# Patient Record
Sex: Female | Born: 1965 | Race: Black or African American | Hispanic: No | Marital: Single | Smoking: Never smoker
Health system: Southern US, Community
[De-identification: ages and names within clinical notes are randomized; demographics above are authoritative.]

## PROBLEM LIST (undated history)

## (undated) DIAGNOSIS — D649 Anemia, unspecified: Secondary | ICD-10-CM

## (undated) DIAGNOSIS — D219 Benign neoplasm of connective and other soft tissue, unspecified: Secondary | ICD-10-CM

## (undated) DIAGNOSIS — B009 Herpesviral infection, unspecified: Secondary | ICD-10-CM

## (undated) HISTORY — DX: Benign neoplasm of connective and other soft tissue, unspecified: D21.9

## (undated) HISTORY — PX: WISDOM TOOTH EXTRACTION: SHX21

## (undated) HISTORY — DX: Herpesviral infection, unspecified: B00.9

## (undated) HISTORY — PX: OTHER SURGICAL HISTORY: SHX169

---

## 2005-02-19 DIAGNOSIS — L309 Dermatitis, unspecified: Secondary | ICD-10-CM

## 2005-02-19 HISTORY — DX: Dermatitis, unspecified: L30.9

## 2005-12-05 DIAGNOSIS — Z91040 Latex allergy status: Secondary | ICD-10-CM | POA: Insufficient documentation

## 2005-12-05 DIAGNOSIS — Z91018 Allergy to other foods: Secondary | ICD-10-CM | POA: Insufficient documentation

## 2006-12-11 ENCOUNTER — Ambulatory Visit: Payer: Self-pay | Admitting: Obstetrics & Gynecology

## 2006-12-11 ENCOUNTER — Encounter: Payer: Self-pay | Admitting: Obstetrics & Gynecology

## 2006-12-18 ENCOUNTER — Ambulatory Visit: Payer: Self-pay | Admitting: Nurse Practitioner

## 2008-01-26 ENCOUNTER — Encounter: Payer: Self-pay | Admitting: Family Medicine

## 2008-01-26 ENCOUNTER — Ambulatory Visit: Payer: Self-pay | Admitting: Obstetrics & Gynecology

## 2008-01-27 ENCOUNTER — Encounter: Payer: Self-pay | Admitting: Obstetrics & Gynecology

## 2008-01-27 LAB — CONVERTED CEMR LAB: Trich, Wet Prep: NONE SEEN

## 2009-03-08 ENCOUNTER — Ambulatory Visit: Payer: Self-pay | Admitting: Obstetrics & Gynecology

## 2009-03-08 ENCOUNTER — Other Ambulatory Visit: Admission: RE | Admit: 2009-03-08 | Discharge: 2009-03-08 | Payer: Self-pay | Admitting: Obstetrics & Gynecology

## 2009-03-08 LAB — CONVERTED CEMR LAB
Glucose, Bld: 87 mg/dL (ref 70–99)
LDL Cholesterol: 88 mg/dL (ref 0–99)
VLDL: 12 mg/dL (ref 0–40)

## 2009-07-12 ENCOUNTER — Ambulatory Visit: Payer: Self-pay | Admitting: Obstetrics & Gynecology

## 2009-07-13 ENCOUNTER — Encounter: Payer: Self-pay | Admitting: Obstetrics & Gynecology

## 2009-07-13 LAB — CONVERTED CEMR LAB: Trich, Wet Prep: NONE SEEN

## 2010-03-13 ENCOUNTER — Encounter: Payer: Self-pay | Admitting: Obstetrics & Gynecology

## 2010-03-13 ENCOUNTER — Other Ambulatory Visit: Admission: RE | Admit: 2010-03-13 | Discharge: 2010-03-13 | Payer: Self-pay | Admitting: Obstetrics & Gynecology

## 2010-03-13 ENCOUNTER — Ambulatory Visit: Payer: Self-pay | Admitting: Family Medicine

## 2010-03-13 LAB — CONVERTED CEMR LAB
HCV Ab: NEGATIVE
Hepatitis B Surface Ag: NEGATIVE

## 2010-03-23 ENCOUNTER — Ambulatory Visit: Payer: Self-pay | Admitting: Obstetrics & Gynecology

## 2010-03-23 ENCOUNTER — Encounter: Payer: Self-pay | Admitting: Obstetrics & Gynecology

## 2010-03-23 LAB — CONVERTED CEMR LAB
ALT: 9 units/L (ref 0–35)
Albumin: 4.1 g/dL (ref 3.5–5.2)
CO2: 25 meq/L (ref 19–32)
Calcium: 9.1 mg/dL (ref 8.4–10.5)
Chloride: 106 meq/L (ref 96–112)
Cholesterol: 131 mg/dL (ref 0–200)
Glucose, Bld: 84 mg/dL (ref 70–99)
HCT: 26.2 % — ABNORMAL LOW (ref 36.0–46.0)
MCV: 70.8 fL — ABNORMAL LOW (ref 78.0–100.0)
Platelets: 332 10*3/uL (ref 150–400)
RBC: 3.7 M/uL — ABNORMAL LOW (ref 3.87–5.11)
Sodium: 139 meq/L (ref 135–145)
Total Bilirubin: 0.3 mg/dL (ref 0.3–1.2)
Total Protein: 7.3 g/dL (ref 6.0–8.3)
Triglycerides: 37 mg/dL (ref <150)
WBC: 3.7 10*3/uL — ABNORMAL LOW (ref 4.0–10.5)

## 2010-04-24 ENCOUNTER — Ambulatory Visit (HOSPITAL_COMMUNITY)
Admission: RE | Admit: 2010-04-24 | Discharge: 2010-04-24 | Payer: Self-pay | Source: Home / Self Care | Attending: Family Medicine | Admitting: Family Medicine

## 2010-08-22 ENCOUNTER — Emergency Department: Payer: Self-pay | Admitting: Emergency Medicine

## 2010-08-28 NOTE — Assessment & Plan Note (Signed)
NAMENAMIRA, [name] NO.:  [id number]   MEDICAL RECORD NO.:  [id number]          PATIENT TYPE:  POB   LOCATION:  [facility] at [facility]         FACILITY:  [facility]   PHYSICIAN:  Elsie Lincoln, MD      DATE OF BIRTH:  Aug 19, 1965   DATE OF SERVICE:  12/18/2006                                  CLINIC NOTE   The patient presents for the results of her Pap smear as well as her STD  screen and cholesterol screenings. We did review her Pap smear as being  normal.  We have not gotten the results of her gonorrhea and chlamydia  as yet.  Hopefully, that will be back in the next few weeks.  We did  review her basic metabolic panel. She was concerned about diabetes and  her glucose, in fact, was at 58.  Her cholesterol was within normal  limits as well as her triglycerides, LDL, and HDL.  She did have a  negative RPR. She did have a positive HSV.  She had not disclosed this  at her last office visit but she has known genital herpes.  She has  taken Valtrex in the past and she will be given a prescription for  Valtrex to take 500 mg 1 p.o. q. day for suppression.  She was also  requesting an HIV test today.  She will be called with the results of  the gonorrhea, chlamydia, as well as HIV.      Remonia Richter, NP    ______________________________  Elsie Lincoln, MD    LR/MEDQ  D:  12/18/2006  T:  12/18/2006  Job:  [id number]

## 2010-08-28 NOTE — Assessment & Plan Note (Signed)
NAMEKAROL, SKARZYNSKI NO.:  [id number]   MEDICAL RECORD NO.:  [id number]          PATIENT TYPE:  POB   LOCATION:  [facility] at [facility]         FACILITY:  [facility]   PHYSICIAN:  Jaynie Collins, MD     DATE OF BIRTH:  [birth year]   DATE OF SERVICE:  03/13/2010                                  CLINIC NOTE   REASON FOR VISIT:  Annual examination.   Ms. Chiles is a 45 year old, gravida 1, para 1, with a last menstrual  period of February 22, 2010, who is here for annual exam, but she has no  complaints.   PAST OBSTETRIC AND GYNECOLOGIC HISTORY:  The patient has had one vaginal  deliveries.  She has regular menstrual periods.  No history of cervical  dysplasia.  The patient does have a history of genital HSV with rare  outbreaks.  No other gynecologic conditions.   PAST MEDICAL HISTORY:  Herpes simplex virus.   PAST SURGICAL HISTORY:  None.   MEDICATIONS:  Valtrex 500 mg as needed.   ALLERGIES:  The patient is allergic to LATEX which causes hives.  No  known drug allergies.   SOCIAL HISTORY:  The patient is in a monogamous relationship over the  past 16 years.  Her partner has had a vasectomy.  She lives with her son  and her mother.  She does not smoke, use alcohol or use any illicit drug  use.  She denies any past or current history of a sexual or physical  abuse.   FAMILY HISTORY:  Remarkable for breast cancer in a paternal grandmother  and also a history of diabetes and thyroid dysfunction.  She denies any  other gynecologic or other cancers.   REVIEW OF SYSTEMS:  Negative.  The patient did have her last mammogram  in 2009, which was negative and will be scheduled for mammogram at the  end of this encounter.   PHYSICAL EXAMINATION:  VITAL SIGNS:  Blood pressure is 106/69, pulse  108, weight 182 pounds, height 5 feet 7 inches.  GENERAL:  No apparent distress.  HEENT:  Normocephalic, atraumatic.  NECK:  Supple.  Normal thyroid.  No masses.  HEART:  Regular  rate and rhythm.  LUNGS:  Clear to auscultation bilaterally.  BREASTS:  Normal bilaterally.  No abnormal skin changes, masses, nipple  drainage or lymphadenopathy.  ABDOMEN:  Soft, nontender, nondistended.  No organomegaly.  EXTREMITIES:  No cyanosis, clubbing or edema.  PELVIC:  Normal external female genitalia.  Pink and well rugated  vagina.  Multiparous cervical os.  Pap smear was obtained.  Small mobile  uterus, nontender.  Adnexa is nontender and no abnormal masses palpated.   ASSESSMENT AND PLAN:  The patient is a 45 year old, gravida 1, para 1,  here for annual examination.  A breast exam was done today and the  patient will be scheduled for a mammogram.  The patient also had a Pap  smear done today.  We will follow up on the result.  She did report  having some malodorous discharge and so ancillary testing was done for  the Pap smear sample for Trichomonas, yeast and for bacterial vaginosis  and would also do  gonorrhea and Chlamydia tested from this sample as per  the patient's request.  The patient would also have serum testing for  hepatitis B, C, HIV and RPR, and we will follow up on these results.  The patient was told to call or come back in if she has any further  gynecologic concerns.           ______________________________  Jaynie Collins, MD     UA/MEDQ  D:  03/13/2010  T:  03/14/2010  Job:  [id number]

## 2010-08-28 NOTE — Assessment & Plan Note (Signed)
NAMEUDELL, BLASINGAME NO.:  [id number]   MEDICAL RECORD NO.:  [id number]          PATIENT TYPE:  POB   LOCATION:  [facility] at [facility]         FACILITY:  [facility]   PHYSICIAN:  Allie Bossier, MD        DATE OF BIRTH:  1965/09/19   DATE OF SERVICE:  03/08/2009                                  CLINIC NOTE   Ms. Tesoriero is a 46 year old female black gravida 1, para 1.  She has an  49 year old son.  She comes in here for annual exam.  She has no  complaints.   PAST MEDICAL HISTORY:  Rare outbreaks of HSV.   REVIEW OF SYSTEMS:  Monogamous for the last 15 years.  Her partner has  had a vasectomy.  She has not yet had a mammogram.   ALLERGIES:  No known drug allergies.  She is LATEX allergic.   SOCIAL HISTORY:  Negative for tobacco, alcohol or drug use.   FAMILY HISTORY:  Positive for breast cancer in a paternal grandmother.  She denies a family history of GYN or colon cancers.  Diabetes is in her  mother and a maternal and thyroid disease is in a paternal aunt.   MEDICATIONS:  She takes Valtrex 500 mg on a p.r.n. basis.   PHYSICAL EXAMINATION:  VITAL SIGNS:  Weight 182, height 5 feet 7 inches,  blood pressure 119/72, pulse 79.  HEENT:  Normal.  HEART:  Regular rate and rhythm.  LUNGS:  Clear to auscultation bilaterally.  BREASTS:  Normal bilaterally.  ABDOMEN:  No palpable hepatosplenomegaly.  PELVIC:  External genitalia normal.  Cervix is parous.  She just started  her period today.  Bimanual exam reveals a 6-week size uterus that is  minimally mobile, nontender.  Her adnexa are nontender.  No masses.   ASSESSMENT AND PLAN:  Annual exam.  I have checked Pap smear and we will  schedule her mammogram.  With regard to her general health maintenance  and family history, I am checking a fasting glucose, TSH and fasting  lipids today.  I have recommended that she continue her weight loss and  start an exercise plan.      Allie Bossier, MD    MCD/MEDQ  D:   03/08/2009  T:  03/09/2009  Job:  [id number]

## 2010-08-28 NOTE — Assessment & Plan Note (Signed)
NAME:  [name], [name] NO.:  [id number]   MEDICAL RECORD NO.:  [id number]          PATIENT TYPE:  POB   LOCATION:  [facility] at [facility]         FACILITY:  [facility]   PHYSICIAN:  Allie Bossier, MD        DATE OF BIRTH:  02-09-1966   DATE OF SERVICE:  01/26/2008                                  CLINIC NOTE   [name] is a 45 year old single black gravida 1, para 1 woman with a  68 year old son.  She comes here for her annual exam.  She complains of  a foul-smelling grayish colored vaginal discharge recently.  She denies  dyspareunia.   PAST MEDICAL HISTORY:  HSV, for which she takes Valtrex 500 mg on a  p.r.n. basis.   REVIEW OF SYSTEMS:  She is monogamous for the last 14 years.  They do  not live together.  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  Her Pap smear in 2008 was normal.   ALLERGIES:  No known drug allergies.  She is LATEX allergic.   SOCIAL HISTORY:  Negative for tobacco, alcohol, or drug use.   FAMILY HISTORY:  Positive for breast cancer in a paternal grandmother.  She denies a family history of GYN or colon cancers.   PHYSICAL EXAMINATION:  VITAL SIGNS:  Weight 184 pounds, height 5 feet 7,  blood pressure 95/67, pulse 81.  HEENT:  Normal.  HEART:  Regular rate and rhythm.  LUNGS:  Clear to auscultation bilaterally.  BREASTS:  Normal.  No masses.  No skin changes.  No nipple discharge.  ABDOMEN:  Benign.  No palpable hepatosplenomegaly.  EXTERNAL GENITALIA:  Unshaved, no lesions.  Cervix parous.  Her  discharge is unremarkable.  A wet prep was obtained.  Pap smears were  done.   ASSESSMENT AND PLAN:  Annual exam.  I have checked a Pap smear.  Recommend self-breast and self-vulvar exams monthly.  With regard to her  vaginal smelly discharge, I have checked a wet prep and will treat  accordingly based on the results when they are available.  A mammogram  will be scheduled.      Allie Bossier, MD     MCD/MEDQ  D:   01/26/2008  T:  01/27/2008  Job:  [id number]

## 2010-08-28 NOTE — Assessment & Plan Note (Signed)
[name], [name] NO.:  [id number]   MEDICAL RECORD NO.:  [id number]          PATIENT TYPE:  POB   LOCATION:  [facility] at [facility]         FACILITY:  [facility]   PHYSICIAN:  Elsie Lincoln, MD      DATE OF BIRTH:  11/25/1965   DATE OF SERVICE:                                  CLINIC NOTE   Patient comes to the office today for her yearly PAP smear.  She has  been having some ongoing vaginal itching for the past 2 weeks.  She also  complains of some odor as well as some brownish color discharge.  She  does admit later that she is still having her menstrual cycle.  She has  a few sexual partners.  One partner, she has been in a relationship for  approximately 13 years and does not use any type of protection.  The  second partner, she has been in a relationship with for 2 years and has  been using condoms with this partner.  She does also admit to having  some vaginal dryness.  She is concerned about her blood sugar.  She did  have a physical in her office and was told that her blood sugar was  elevated.  She feels that this may also have something to do with her  vaginal itching.   PHYSICAL EXAMINATION:  VITAL SIGNS:  Blood pressure 113/73, pulse 72,  weight 176.  HEENT:  Head is normocephalic and atraumatic.  Eyes are PERRLA.  NECK:  Thyroid without masses or enlargement.  CARDIAC:  Regular rate and rhythm.  LUNGS:  Clear bilaterally.  BREASTS:  Symmetrical without masses.  No lymphadenopathy.  ABDOMEN:  Soft, nontender, positive bowel sounds.  GENITALIA:  Externally there are no lesions or discharges.  Internally,  she is having her menstrual cycle and there is bright red blood in the  vaginal vault.  Her cervix is without lesions.  Bimanual exam, no  cervical motion tenderness.  No adnexal mass.  EXTREMITIES:  Warm without edema.  NEUROLOGIC:  Alert and oriented x3.   ASSESSMENT/PLAN:  1. Yearly PAP and pelvic.  2. Vaginal discharge.  She will be given  Terazol 7 to use nightly for      7 nights.  We will also check a chem 20, lipid panel, HSC, PC,      chlamydia and RPR.  We will follow up in 1 week to review the      results of these findings.      Remonia Richter, NP    ______________________________  Elsie Lincoln, MD    LR/MEDQ  D:  12/11/2006  T:  12/12/2006  Job:  [id number]

## 2011-03-21 ENCOUNTER — Ambulatory Visit: Payer: Self-pay | Admitting: Obstetrics & Gynecology

## 2011-03-21 ENCOUNTER — Other Ambulatory Visit: Payer: Self-pay | Admitting: Family Medicine

## 2011-03-21 DIAGNOSIS — Z1231 Encounter for screening mammogram for malignant neoplasm of breast: Secondary | ICD-10-CM

## 2011-05-02 ENCOUNTER — Ambulatory Visit (HOSPITAL_COMMUNITY)
Admission: RE | Admit: 2011-05-02 | Discharge: 2011-05-02 | Disposition: A | Discharge status: Home / Self Care | Payer: Medicaid Other | Source: Ambulatory Visit | Attending: Family Medicine | Admitting: Family Medicine

## 2011-05-02 DIAGNOSIS — Z1231 Encounter for screening mammogram for malignant neoplasm of breast: Secondary | ICD-10-CM

## 2011-05-14 ENCOUNTER — Encounter: Payer: Self-pay | Admitting: Nurse Practitioner

## 2011-05-14 ENCOUNTER — Other Ambulatory Visit: Payer: Self-pay | Admitting: Obstetrics & Gynecology

## 2011-05-14 ENCOUNTER — Other Ambulatory Visit (HOSPITAL_COMMUNITY)
Admission: RE | Admit: 2011-05-14 | Discharge: 2011-05-14 | Disposition: A | Discharge status: Home / Self Care | Payer: Medicaid Other | Source: Ambulatory Visit | Attending: Obstetrics & Gynecology | Admitting: Obstetrics & Gynecology

## 2011-05-14 ENCOUNTER — Ambulatory Visit (INDEPENDENT_AMBULATORY_CARE_PROVIDER_SITE_OTHER): Payer: Medicaid Other | Admitting: Nurse Practitioner

## 2011-05-14 DIAGNOSIS — Z0000 Encounter for general adult medical examination without abnormal findings: Secondary | ICD-10-CM

## 2011-05-14 DIAGNOSIS — A499 Bacterial infection, unspecified: Secondary | ICD-10-CM

## 2011-05-14 DIAGNOSIS — B9689 Other specified bacterial agents as the cause of diseases classified elsewhere: Secondary | ICD-10-CM

## 2011-05-14 DIAGNOSIS — N760 Acute vaginitis: Secondary | ICD-10-CM | POA: Insufficient documentation

## 2011-05-14 DIAGNOSIS — B009 Herpesviral infection, unspecified: Secondary | ICD-10-CM

## 2011-05-14 DIAGNOSIS — Z01419 Encounter for gynecological examination (general) (routine) without abnormal findings: Secondary | ICD-10-CM | POA: Insufficient documentation

## 2011-05-14 DIAGNOSIS — D649 Anemia, unspecified: Secondary | ICD-10-CM | POA: Insufficient documentation

## 2011-05-14 HISTORY — DX: Herpesviral infection, unspecified: B00.9

## 2011-05-14 MED ORDER — FERROUS SULFATE 325 (65 FE) MG PO TABS: 325.0000 mg | ORAL_TABLET | Freq: Every day | ORAL | Status: DC

## 2011-05-14 MED ORDER — VALACYCLOVIR HCL 500 MG PO TABS: 500.0000 mg | ORAL_TABLET | Freq: Every day | ORAL | Status: DC

## 2011-05-14 MED ORDER — METRONIDAZOLE 500 MG PO TABS: 500.0000 mg | ORAL_TABLET | Freq: Two times a day (BID) | ORAL | Status: DC

## 2011-05-14 NOTE — Patient Instructions (Signed)
Bacterial Vaginosis Bacterial vaginosis (BV) is a vaginal infection where the normal balance of bacteria in the vagina is disrupted. The normal balance is then replaced by an overgrowth of certain bacteria. There are several different kinds of bacteria that can cause BV. BV is the most common vaginal infection in women of childbearing age. CAUSES   The cause of BV is not fully understood. BV develops when there is an increase or imbalance of harmful bacteria.   Some activities or behaviors can upset the normal balance of bacteria in the vagina and put women at increased risk including:   Having a new sex partner or multiple sex partners.   Douching.   Using an intrauterine device (IUD) for contraception.   It is not clear what role sexual activity plays in the development of BV. However, women that have never had sexual intercourse are rarely infected with BV.  Women do not get BV from toilet seats, bedding, swimming pools or from touching objects around them.  SYMPTOMS   Grey vaginal discharge.   A fish-like odor with discharge, especially after sexual intercourse.   Itching or burning of the vagina and vulva.   Burning or pain with urination.   Some women have no signs or symptoms at all.  DIAGNOSIS  Your caregiver must examine the vagina for signs of BV. Your caregiver will perform lab tests and look at the sample of vaginal fluid through a microscope. They will look for bacteria and abnormal cells (clue cells), a pH test higher than 4.5, and a positive amine test all associated with BV.  RISKS AND COMPLICATIONS   Pelvic inflammatory disease (PID).   Infections following gynecology surgery.   Developing HIV.   Developing herpes virus.  TREATMENT  Sometimes BV will clear up without treatment. However, all women with symptoms of BV should be treated to avoid complications, especially if gynecology surgery is planned. Female partners generally do not need to be treated. However,  BV may spread between female sex partners so treatment is helpful in preventing a recurrence of BV.   BV may be treated with antibiotics. The antibiotics come in either pill or vaginal cream forms. Either can be used with nonpregnant or pregnant women, but the recommended dosages differ. These antibiotics are not harmful to the baby.   BV can recur after treatment. If this happens, a second round of antibiotics will often be prescribed.   Treatment is important for pregnant women. If not treated, BV can cause a premature delivery, especially for a pregnant woman who had a premature birth in the past. All pregnant women who have symptoms of BV should be checked and treated.   For chronic reoccurrence of BV, treatment with a type of prescribed gel vaginally twice a week is helpful.  HOME CARE INSTRUCTIONS   Finish all medication as directed by your caregiver.   Do not have sex until treatment is completed.   Tell your sexual partner that you have a vaginal infection. They should see their caregiver and be treated if they have problems, such as a mild rash or itching.   Practice safe sex. Use condoms. Only have 1 sex partner.  PREVENTION  Basic prevention steps can help reduce the risk of upsetting the natural balance of bacteria in the vagina and developing BV:  Do not have sexual intercourse (be abstinent).   Do not douche.   Use all of the medicine prescribed for treatment of BV, even if the signs and symptoms go away.     Tell your sex partner if you have BV. That way, they can be treated, if needed, to prevent reoccurrence.  SEEK MEDICAL CARE IF:   Your symptoms are not improving after 3 days of treatment.   You have increased discharge, pain, or fever.  MAKE SURE YOU:   Understand these instructions.   Will watch your condition.   Will get help right away if you are not doing well or get worse.  FOR MORE INFORMATION  Division of STD Prevention (DSTDP), Centers for Disease  Control and Prevention: SolutionApps.co.za American Social Health Association (ASHA): www.ashastd.org  Document Released: 04/01/2005 Document Revised: 12/12/2010 Document Reviewed: 09/22/2008 [facility] Patient Information 2012 [place], [place].Iron Deficiency Anemia There are many types of anemia. Iron deficiency anemia is the most common. Iron deficiency anemia is a decrease in the number of red blood cells caused by too little iron. Without enough iron, your body does not produce enough hemoglobin. Hemoglobin is a substance in red blood cells that carries oxygen to the body's tissues. Iron deficiency anemia may leave you tired and short of breath. CAUSES   Lack of iron in the diet.   This may be seen in infants and children, because there is little iron in milk.   This may be seen in adults who do not eat enough iron-rich foods.   This may be seen in pregnant or breastfeeding women who do not take iron supplements. There is a much higher need for iron intake at these times.   Poor absorption of iron, as seen with intestinal disorders.   Intestinal bleeding.   Heavy periods.  SYMPTOMS  Mild anemia may not be noticeable. Symptoms may include:  Fatigue.   Headache.   Pale skin.   Weakness.   Shortness of breath.   Dizziness.   Cold hands and feet.   Fast or irregular heartbeat.  DIAGNOSIS  Diagnosis requires a thorough evaluation and physical exam by your caregiver.  Blood tests are generally used to confirm iron deficiency anemia.   Additional tests may be done to find the underlying cause of your anemia. These may include:   Testing for blood in the stool (fecal occult blood test).   A procedure to see inside the colon and rectum (colonoscopy).   A procedure to see inside the esophagus and stomach (endoscopy).  TREATMENT   Correcting the cause of the iron deficiency is the first step.   Medicines, such as oral contraceptives, can make heavy menstrual flows lighter.     Antibiotics and other medicines can be used to treat peptic ulcers.   Surgery may be needed to remove a bleeding polyp, tumor, or fibroid.   Often, iron supplements (ferrous sulfate) are taken.   For the best iron absorption, take these supplements with an empty stomach.   You may need to take the supplements with food if you cannot tolerate them on an empty stomach. Vitamin C improves the absorption of iron. Your caregiver may recommend taking your iron tablets with a glass of orange juice or vitamin C supplement.   Milk and antacids should not be taken at the same time as iron supplements. They may interfere with the absorption of iron.   Iron supplements can cause constipation. A stool softener is often recommended.   Pregnant and breastfeeding women will need to take extra iron, because their normal diet usually will not provide the required amount.   Patients who cannot tolerate iron by mouth can take it through a vein (intravenously) or by  an injection into the muscle.  HOME CARE INSTRUCTIONS   Ask your dietitian for help with diet questions.   Take iron and vitamins as directed by your caregiver.   Eat a diet rich in iron. Eat liver, lean beef, whole-grain bread, eggs, dried fruit, and dark green leafy vegetables.  SEEK IMMEDIATE MEDICAL CARE IF:   You have a fainting episode. Do not drive yourself. Call your local emergency services (911 in [place] if no other help is available.   You have chest pain, nausea, or vomiting.   You develop severe or increased shortness of breath with activities.   You develop weakness or increased thirst.   You have a rapid heartbeat.   You develop unexplained sweating or become lightheaded when getting up from a chair or bed.  MAKE SURE YOU:   Understand these instructions.   Will watch your condition.   Will get help right away if you are not doing well or get worse.  Document Released: 03/29/2000 Document Revised: 12/12/2010  Document Reviewed: 08/08/2009 [facility] Patient Information 2012 [place], [place].

## 2011-05-14 NOTE — Progress Notes (Signed)
Subjective:     Patient ID: [name], female   DOB: [birth year], 46 y.o.   MRN: [id number]  HPI Pt here today for annual GYN exam. Last seen Nov 2011. At that time she had H/H done that was 7.5 and 26.2. There was some confusion about her being called with results. She was not informed of results or plan. Since then she admits she has somewhat heavy menses. Three days heavy menses, changes pad q1-2 hours then 3-4 moderate days where she changes pad q3-4 hours. Her mother just died in 05/14/2023 with uterine cancer. She had mammogram 2 weeks ago and it was negative. She has a strong vaginal odor and would like to be checked for bacterial vaginosis. She needs refill for Valtrex which she takes as needed.    Review of Systems  Constitutional: Negative for fever, chills, diaphoresis, activity change, appetite change, fatigue and unexpected weight change.  HENT: Negative.   Eyes: Negative.   Respiratory: Negative.   Cardiovascular: Negative.   Gastrointestinal: Negative.   Genitourinary: Positive for vaginal bleeding and menstrual problem.  Neurological:       Headache  Hematological: Negative.   Psychiatric/Behavioral: Negative.        Objective:   Physical Exam  Constitutional: She is oriented to person, place, and time. She appears well-developed and well-nourished.  Eyes: Pupils are equal, round, and reactive to light.  Neck: Normal range of motion. No tracheal deviation present. No thyromegaly present.  Cardiovascular: Normal rate, regular rhythm and normal heart sounds.   Pulmonary/Chest: Effort normal and breath sounds normal. She has no wheezes. She has no rales. She exhibits no tenderness.  Abdominal: Soft. Bowel sounds are normal. She exhibits no distension. There is no tenderness.  Genitourinary: Vaginal discharge found.       Firmness to uterus. Strong vaginal odor  Musculoskeletal: Normal range of motion.  Neurological: She is alert and oriented to person, place, and time.   Skin: Skin is warm.  Psychiatric: She has a normal mood and affect.       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 Recent Mammogram Refill Valtrex Check labs CBC, TSH, Ferritin Start MVI with iron and additional Feso4 Pelvic Ultrasound to R/O fibroids Flagyl 500mg  bid x7 days Follow up with labs/ ultrasound

## 2011-05-15 LAB — CBC
Hemoglobin: 7.9 g/dL — ABNORMAL LOW (ref 12.0–15.0)
MCH: 20.4 pg — ABNORMAL LOW (ref 26.0–34.0)
MCHC: 28.6 g/dL — ABNORMAL LOW (ref 30.0–36.0)
Platelets: 272 10*3/uL (ref 150–400)
RDW: 18.6 % — ABNORMAL HIGH (ref 11.5–15.5)

## 2011-05-15 LAB — WET PREP, GENITAL: Yeast Wet Prep HPF POC: NONE SEEN

## 2011-05-15 LAB — FERRITIN: Ferritin: 6 ng/mL — ABNORMAL LOW (ref 10–291)

## 2011-05-15 LAB — TSH: TSH: 1.096 u[IU]/mL (ref 0.350–4.500)

## 2011-06-05 ENCOUNTER — Encounter: Payer: Self-pay | Admitting: Obstetrics & Gynecology

## 2011-06-05 ENCOUNTER — Ambulatory Visit (INDEPENDENT_AMBULATORY_CARE_PROVIDER_SITE_OTHER): Payer: Medicaid Other | Admitting: Obstetrics & Gynecology

## 2011-06-05 VITALS — BP 118/72 | HR 77 | Ht 66.0 in | Wt 184.0 lb

## 2011-06-05 DIAGNOSIS — N920 Excessive and frequent menstruation with regular cycle: Secondary | ICD-10-CM

## 2011-06-05 DIAGNOSIS — D649 Anemia, unspecified: Secondary | ICD-10-CM

## 2011-06-05 HISTORY — DX: Excessive and frequent menstruation with regular cycle: N92.0

## 2011-06-05 NOTE — Progress Notes (Signed)
History:  46 y.o. G1P1 here today for follow up evaluation of menorrhagia.  On iron therapy.  Labs done   The following portions of the patient's history were reviewed and updated as appropriate: allergies, current medications, past family history, past medical history, past social history, past surgical history and problem list.   Objective:  Physical Exam Blood pressure 118/72, pulse 77, height 5\' 6"  (1.676 m), weight 184 lb (83.462 kg), last menstrual period 05/21/2011. Deferred  Labs and Imaging Lab Results  Component Value Date   WBC 4.2 05/14/2011   HGB 7.9* 05/14/2011   HCT 27.6* 05/14/2011   MCV 71.1* 05/14/2011   PLT 272 05/14/2011  Normal TSH.  Ferritin 6.  05/23/11 Pelvic Ultrasound at [facility] : 10 week sized uterus, multiple fibroids: 0.9 cm intramural, 1.6 cm intramural posterior fundus, 3.4 cm intramural fundal.  Endometrium 4.1 mm, small nabothian cyst noted. Normal ovaries bilaterally.  Assessment & Plan:   Discussed results, recommended endometrial biopsy for further evaluation. Patient wants to return next visit to get his done, wants to premedicate with Ibuprofen.  Discussed management options for menorrhagia including oral Provera, Depo Provera, Mirena IUD, endometrial ablation (Novasure/Hydrothermal Ablation/Thermachoice) or hysterectomy as definitive surgical management.  Discussed risks and benefits of each method. Patient desires HTA, information given to patient. Bleeding precautions reviewed. Will return in about one week for endometrial biopsy, preoperative exam, further discussion about surgery.

## 2011-06-05 NOTE — Patient Instructions (Signed)
Endometrial Biopsy This is a test in which a tissue sample (a biopsy) is taken from inside the uterus (womb). It is then looked at by a specialist under a microscope to see if the tissue is normal or abnormal. The endometrium is the lining of the uterus. This test helps determine where you are in your menstrual cycle and how hormone levels are affecting the lining of the uterus. Another use for this test is to diagnose endometrial cancer, tuberculosis, polyps, or inflammatory conditions and to evaluate uterine bleeding. PREPARATION FOR TEST No preparation or fasting is necessary. You can take 600 mg of Ibuprofen one hour before your appointment. NORMAL FINDINGS No pathologic conditions. Presence of "secretory-type" endometrium 3 to 5 days before to normal menstruation. Ranges for normal findings may vary among different laboratories and hospitals. You should always check with your doctor after having lab work or other tests done to discuss the meaning of your test results and whether your values are considered within normal limits. MEANING OF TEST  Your caregiver will go over the test results with you and discuss the importance and meaning of your results, as well as treatment options and the need for additional tests if necessary. OBTAINING THE TEST RESULTS It is your responsibility to obtain your test results. Ask the lab or department performing the test when and how you will get your results. Document Released: 08/02/2004 Document Revised: 12/12/2010 Document Reviewed: 03/11/2008 [facility] : [facility] Patient Information 2012 [place], [place].  Endometrial Ablation Endometrial ablation removes the lining of the uterus (endometrium). It is usually a same day, outpatient treatment. Ablation helps avoid major surgery (such as a hysterectomy). A hysterectomy is removal of the cervix and uterus. Endometrial ablation has less risk and complications, has a shorter recovery period and is less expensive. After  endometrial ablation, most women will have little or no menstrual bleeding. You may not keep your fertility. Pregnancy is no longer likely after this procedure but if you are pre-menopausal, you still need to use a reliable method of birth control following the procedure because pregnancy can occur. REASONS TO HAVE THE PROCEDURE MAY INCLUDE:  Heavy periods.   Bleeding that is causing anemia.   Anovulatory bleeding, very irregular, bleeding.   Bleeding submucous fibroids (on the lining inside the uterus) if they are smaller than 3 centimeters.  REASONS NOT TO HAVE THE PROCEDURE MAY INCLUDE:  You wish to have more children.   You have a pre-cancerous or cancerous problem. The cause of any abnormal bleeding must be diagnosed before having the procedure.   You have pain coming from the uterus.   You have a submucus fibroid larger than 3 centimeters.   You recently had a baby.   You recently had an infection in the uterus.   You have a severe retro-flexed, tipped uterus and cannot insert the instrument to do the ablation.   You had a Cesarean section or deep major surgery on the uterus.   The inner cavity of the uterus is too large for the endometrial ablation instrument.  RISKS AND COMPLICATIONS   Perforation of the uterus.   Bleeding.   Infection of the uterus, bladder or vagina.   Injury to surrounding organs.   Cutting the cervix.   An air bubble to the lung (air embolus).   Pregnancy following the procedure.   Failure of the procedure to help the problem requiring hysterectomy.   Decreased ability to diagnose cancer in the lining of the uterus.  BEFORE THE PROCEDURE  The  lining of the uterus must be tested to make sure there is no pre-cancerous or cancer cells present.   Medications may be given to make the lining of the uterus thinner.   Ultrasound may be used to evaluate the size and look for abnormalities of the uterus.   Future pregnancy is not desired.    PROCEDURE  There are different ways to destroy the lining of the uterus.   Resectoscope - radio frequency-alternating electric current is the most common one used.   Cryotherapy - freezing the lining of the uterus.   Heated Free Liquid - heated salt (saline) solution inserted into the uterus.   Microwave - uses high energy microwaves in the uterus.   Thermal Balloon - a catheter with a balloon tip is inserted into the uterus and filled with heated fluid.  Your caregiver will talk with you about the method used in this clinic. They will also instruct you on the pros and cons of the procedure. Endometrial ablation is performed along with a procedure called operative hysteroscopy. A narrow viewing tube is inserted through the birth canal (vagina) and through the cervix into the uterus. A tiny camera attached to the viewing tube (hysteroscope) allows the uterine cavity to be shown on a TV monitor during surgery. Your uterus is filled with a harmless liquid to make the procedure easier. The lining of the uterus is then removed. The lining can also be removed with a resectoscope which allows your surgeon to cut away the lining of the uterus under direct vision. Usually, you will be able to go home within an hour after the procedure. HOME CARE INSTRUCTIONS   Do not drive for 24 hours.   No tampons, douching or intercourse for 2 weeks or until your caregiver approves.   Rest at home for 24 to 48 hours. You may then resume normal activities unless told differently by your caregiver.   Take any medications your caregiver has ordered, as directed.   Use some form of contraception if you are pre-menopausal and do not want to get pregnant.  Bleeding after the procedure is normal. It varies from light spotting and mildly watery to bloody discharge for 4 to 6 weeks. You may also have mild cramping. Only take over-the-counter or prescription medicines for pain, discomfort, or fever as directed by your  caregiver. Do not use aspirin, as this may aggravate bleeding. Frequent urination during the first 24 hours is normal. You will not know how effective your surgery is until at least 3 months after the surgery. SEEK IMMEDIATE MEDICAL CARE IF:   Bleeding is heavier than a normal menstrual cycle.   An oral temperature above 102 F (38.9 C) develops.   You have increasing cramps or pains not relieved with medication or develop belly (abdominal) pain which does not seem to be related to the same area of earlier cramping and pain.   You are light headed, weak or have fainting episodes.   You develop pain in the shoulder strap areas.   You have chest or leg pain.   You have abnormal vaginal discharge.   You have painful urination.  Document Released: 02/09/2004 Document Revised: 12/12/2010 Document Reviewed: 05/09/2007 [facility] Patient Information 2012 [place], [place].

## 2011-06-11 ENCOUNTER — Encounter: Payer: Self-pay | Admitting: Obstetrics & Gynecology

## 2011-06-11 ENCOUNTER — Encounter (HOSPITAL_COMMUNITY): Payer: Self-pay | Admitting: *Deleted

## 2011-06-13 ENCOUNTER — Encounter: Payer: Self-pay | Admitting: Obstetrics & Gynecology

## 2011-06-13 ENCOUNTER — Ambulatory Visit (INDEPENDENT_AMBULATORY_CARE_PROVIDER_SITE_OTHER): Payer: Medicaid Other | Admitting: Obstetrics & Gynecology

## 2011-06-13 VITALS — BP 123/67 | HR 72 | Ht 66.0 in | Wt 182.0 lb

## 2011-06-13 DIAGNOSIS — D649 Anemia, unspecified: Secondary | ICD-10-CM

## 2011-06-13 DIAGNOSIS — N920 Excessive and frequent menstruation with regular cycle: Secondary | ICD-10-CM

## 2011-06-13 MED ORDER — MEDROXYPROGESTERONE ACETATE 10 MG PO TABS: 20.0000 mg | ORAL_TABLET | Freq: Every day | ORAL | Status: DC

## 2011-06-13 NOTE — Progress Notes (Signed)
History:  45 y.o. G1P1 here today for follow up evaluation of menorrhagia. She is scheduled for Hydrothermal Ablation (HTA) on 07/09/11, and is here today for preoperative endometrial biopsy.  No complaints.  The following portions of the patient's history were reviewed and updated as appropriate: allergies, current medications, past family history, past medical history, past social history, past surgical history and problem list.   Objective:  Physical Exam Blood pressure 123/67, pulse 72, height 5' 6" (1.676 m), weight 182 lb (82.555 kg), last menstrual period 05/29/2011. Gen: NAD Abdomen: Soft, NT, ND Pelvic: NEFG. Multiparous os, normal vaginal discharge.  ENDOMETRIAL BIOPSY     The indications for endometrial biopsy were reviewed.   Risks of the biopsy including cramping, bleeding, infection, uterine perforation, inadequate specimen and need for additional procedures  were discussed. The patient states she understands and agrees to undergo procedure today. Consent was signed. Time out was performed.   A sterile speculum was placed in the patient's vagina and the cervix was prepped with Betadine. The 3 mm pipelle was introduced into the endometrial cavity without difficulty to a depth of 9 cm, and a moderate amount of tissue was obtained and sent to pathology. The instruments were removed from the patient's vagina. Minimal bleeding from the cervix was noted. The patient tolerated the procedure well. Routine post-procedure instructions were given to the patient.   Labs and Imaging Endometrial Biopsy: Pending  Lab Results  Component Value Date   WBC 4.2 05/14/2011   HGB 7.9* 05/14/2011   HCT 27.6* 05/14/2011   MCV 71.1* 05/14/2011   PLT 272 05/14/2011  Normal TSH.  Ferritin 6.  05/23/11 Pelvic Ultrasound at [facility] : 10 week sized uterus, multiple fibroids: 0.9 cm intramural, 1.6 cm intramural posterior fundus, 3.4 cm intramural fundal.  Endometrium 4.1 mm, small nabothian cyst noted. Normal  ovaries bilaterally.  Assessment & Plan:  Will follow up results of biopsy. If normal, will proceed with surgery as planned. Routine preoperative instructions of having nothing to eat or drink after midnight on the day prior to surgery and also coming to the hospital 1 1/2 hours prior to her time of surgery were also emphasized.  Questions about HTA addressed.  Bleeding precautions reviewed; Provera prescribed to be used as needed for heavy bleeding and patient to continue iron therapy.  Return to clinic as needed.  

## 2011-06-13 NOTE — Patient Instructions (Signed)
Endometrial Biopsy This is a test in which a tissue sample (a biopsy) is taken from inside the uterus (womb). It is then looked at by a specialist under a microscope to see if the tissue is normal or abnormal. The endometrium is the lining of the uterus. This test helps determine where you are in your menstrual cycle and how hormone levels are affecting the lining of the uterus. Another use for this test is to diagnose endometrial cancer, tuberculosis, polyps, or inflammatory conditions and to evaluate uterine bleeding. POSSIBLE NORMAL FINDINGS No pathologic conditions. Presence of "secretory-type" endometrium 3 to 5 days before to normal menstruation. Ranges for normal findings may vary among different laboratories and hospitals. You should always check with your doctor after having lab work or other tests done to discuss the meaning of your test results and whether your values are considered within normal limits. MEANING OF TEST  Your caregiver will go over the test results with you and discuss the importance and meaning of your results, as well as treatment options and the need for additional tests if necessary. OBTAINING THE TEST RESULTS It is your responsibility to obtain your test results. Ask the lab or department performing the test when and how you will get your results. Document Released: 08/02/2004 Document Revised: 12/12/2010 Document Reviewed: 03/11/2008 [facility] Patient Information 2012 [place], [place].

## 2011-06-20 ENCOUNTER — Encounter (HOSPITAL_COMMUNITY): Payer: Self-pay

## 2011-06-26 ENCOUNTER — Telehealth: Payer: Self-pay | Admitting: *Deleted

## 2011-06-26 DIAGNOSIS — D649 Anemia, unspecified: Secondary | ICD-10-CM

## 2011-06-26 MED ORDER — FERROUS SULFATE 325 (65 FE) MG PO TABS: 325.0000 mg | ORAL_TABLET | Freq: Two times a day (BID) | ORAL | Status: DC

## 2011-06-26 NOTE — Telephone Encounter (Signed)
Patient needs her prescription changed to twice daily due to she was recommended to take it twice daily.  She also wanted to let us know she was prescribed a z pack from her primary care doctor for a sore throat.

## 2011-07-09 ENCOUNTER — Encounter (HOSPITAL_COMMUNITY): Payer: Self-pay | Admitting: Anesthesiology

## 2011-07-09 ENCOUNTER — Encounter (HOSPITAL_COMMUNITY): Payer: Self-pay | Admitting: *Deleted

## 2011-07-09 ENCOUNTER — Ambulatory Visit (HOSPITAL_COMMUNITY)
Admission: RE | Admit: 2011-07-09 | Discharge: 2011-07-09 | Disposition: A | Discharge status: Home / Self Care | Payer: Medicaid Other | Source: Ambulatory Visit | Attending: Obstetrics & Gynecology | Admitting: Obstetrics & Gynecology

## 2011-07-09 ENCOUNTER — Ambulatory Visit (HOSPITAL_COMMUNITY): Payer: Medicaid Other | Admitting: Anesthesiology

## 2011-07-09 ENCOUNTER — Encounter (HOSPITAL_COMMUNITY)
Admission: RE | Disposition: A | Discharge status: Home / Self Care | Payer: Self-pay | Source: Ambulatory Visit | Attending: Obstetrics & Gynecology

## 2011-07-09 DIAGNOSIS — D649 Anemia, unspecified: Secondary | ICD-10-CM

## 2011-07-09 DIAGNOSIS — N920 Excessive and frequent menstruation with regular cycle: Secondary | ICD-10-CM | POA: Insufficient documentation

## 2011-07-09 HISTORY — DX: Anemia, unspecified: D64.9

## 2011-07-09 HISTORY — PX: HYSTEROSCOPY W/ ENDOMETRIAL ABLATION: SUR665

## 2011-07-09 LAB — CBC
Hemoglobin: 11.9 g/dL — ABNORMAL LOW (ref 12.0–15.0)
MCH: 26.4 pg (ref 26.0–34.0)
Platelets: 176 10*3/uL (ref 150–400)
RBC: 4.5 MIL/uL (ref 3.87–5.11)
WBC: 4.3 10*3/uL (ref 4.0–10.5)

## 2011-07-09 LAB — PREGNANCY, URINE: Preg Test, Ur: NEGATIVE

## 2011-07-09 SURGERY — DILATATION & CURETTAGE/HYSTEROSCOPY WITH HYDROTHERMAL ABLATION
Anesthesia: General | Site: Uterus | Wound class: Clean Contaminated

## 2011-07-09 MED ORDER — IBUPROFEN 600 MG PO TABS: 600.0000 mg | ORAL_TABLET | Freq: Four times a day (QID) | ORAL | Status: AC | PRN

## 2011-07-09 MED ORDER — LIDOCAINE HCL (CARDIAC) 20 MG/ML IV SOLN
INTRAVENOUS | Status: DC | PRN
  Administered 2011-07-09: 80 mg via INTRAVENOUS

## 2011-07-09 MED ORDER — KETOROLAC TROMETHAMINE 30 MG/ML IJ SOLN
INTRAMUSCULAR | Status: DC | PRN
  Administered 2011-07-09: 30 mg via INTRAVENOUS

## 2011-07-09 MED ORDER — LIDOCAINE HCL (CARDIAC) 20 MG/ML IV SOLN
INTRAVENOUS | Status: AC
  Filled 2011-07-09: qty 5

## 2011-07-09 MED ORDER — FENTANYL CITRATE 0.05 MG/ML IJ SOLN
INTRAMUSCULAR | Status: DC | PRN
  Administered 2011-07-09: 100 ug via INTRAVENOUS

## 2011-07-09 MED ORDER — SODIUM CHLORIDE 0.9 % IR SOLN
Status: DC | PRN
  Administered 2011-07-09: 3000 mL

## 2011-07-09 MED ORDER — ONDANSETRON HCL 4 MG/2ML IJ SOLN
INTRAMUSCULAR | Status: AC
  Filled 2011-07-09: qty 2

## 2011-07-09 MED ORDER — DEXAMETHASONE SODIUM PHOSPHATE 4 MG/ML IJ SOLN
INTRAMUSCULAR | Status: DC | PRN
  Administered 2011-07-09: 6 mg via INTRAVENOUS

## 2011-07-09 MED ORDER — DEXAMETHASONE SODIUM PHOSPHATE 10 MG/ML IJ SOLN
INTRAMUSCULAR | Status: AC
  Filled 2011-07-09: qty 1

## 2011-07-09 MED ORDER — PROPOFOL 10 MG/ML IV EMUL
INTRAVENOUS | Status: AC
  Filled 2011-07-09: qty 20

## 2011-07-09 MED ORDER — DOCUSATE SODIUM 100 MG PO CAPS: 100.0000 mg | ORAL_CAPSULE | Freq: Two times a day (BID) | ORAL | Status: AC | PRN

## 2011-07-09 MED ORDER — BUPIVACAINE HCL (PF) 0.25 % IJ SOLN
INTRAMUSCULAR | Status: AC
  Filled 2011-07-09: qty 30

## 2011-07-09 MED ORDER — OXYCODONE-ACETAMINOPHEN 5-325 MG PO TABS: 1.0000 | ORAL_TABLET | Freq: Four times a day (QID) | ORAL | Status: AC | PRN

## 2011-07-09 MED ORDER — LACTATED RINGERS IV SOLN
INTRAVENOUS | Status: DC
  Administered 2011-07-09 (×2): via INTRAVENOUS

## 2011-07-09 MED ORDER — MIDAZOLAM HCL 2 MG/2ML IJ SOLN
INTRAMUSCULAR | Status: AC
  Filled 2011-07-09: qty 2

## 2011-07-09 MED ORDER — BUPIVACAINE HCL (PF) 0.5 % IJ SOLN
INTRAMUSCULAR | Status: DC | PRN
  Administered 2011-07-09: 30 mL

## 2011-07-09 MED ORDER — ONDANSETRON HCL 4 MG/2ML IJ SOLN
INTRAMUSCULAR | Status: DC | PRN
  Administered 2011-07-09: 4 mg via INTRAVENOUS

## 2011-07-09 MED ORDER — FENTANYL CITRATE 0.05 MG/ML IJ SOLN
INTRAMUSCULAR | Status: AC
  Filled 2011-07-09: qty 2

## 2011-07-09 MED ORDER — BUPIVACAINE HCL (PF) 0.5 % IJ SOLN
INTRAMUSCULAR | Status: AC
  Filled 2011-07-09: qty 30

## 2011-07-09 MED ORDER — PROPOFOL 10 MG/ML IV EMUL
INTRAVENOUS | Status: DC | PRN
  Administered 2011-07-09: 180 mg via INTRAVENOUS

## 2011-07-09 MED ORDER — MIDAZOLAM HCL 5 MG/5ML IJ SOLN
INTRAMUSCULAR | Status: DC | PRN
  Administered 2011-07-09: 2 mg via INTRAVENOUS

## 2011-07-09 MED ORDER — KETOROLAC TROMETHAMINE 30 MG/ML IJ SOLN
INTRAMUSCULAR | Status: AC
  Filled 2011-07-09: qty 1

## 2011-07-09 SURGICAL SUPPLY — 10 items
CLOTH BEACON ORANGE TIMEOUT ST (SAFETY) ×2 IMPLANT
CONTAINER PREFILL 10% NBF 60ML (FORM) ×2 IMPLANT
DRAPE HYSTEROSCOPY (DRAPE) ×2 IMPLANT
GLOVE BIO SURGEON STRL SZ7 (GLOVE) ×4 IMPLANT
GOWN PREVENTION PLUS LG XLONG (DISPOSABLE) ×4 IMPLANT
GOWN STRL REIN XL XLG (GOWN DISPOSABLE) ×2 IMPLANT
PACK VAGINAL MINOR WOMEN LF (CUSTOM PROCEDURE TRAY) ×2 IMPLANT
SET GENESYS HTA PROCERVA (MISCELLANEOUS) ×2 IMPLANT
TOWEL OR 17X24 6PK STRL BLUE (TOWEL DISPOSABLE) ×4 IMPLANT
WATER STERILE IRR 1000ML POUR (IV SOLUTION) ×1 IMPLANT

## 2011-07-09 NOTE — Interval H&P Note (Signed)
History and Physical Interval Note: 07/09/2011 11:20 AM  [name]  has presented today for surgery, with the diagnosis of Menorrhagia  The various methods of treatment have been discussed with the patient and family. After consideration of risks, benefits and other options for treatment, the patient has consented to  Procedure(s) (LRB):DILATATION & CURETTAGE,HYSTEROSCOPY WITH HYDROTHERMAL ABLATION as a surgical intervention.  The patients' history has been reviewed, patient examined, no change in status, stable for surgery.  I have reviewed the patients' chart and labs. Endometrial biopsy done in the office on 06/13/11 showed secretory endometrium.  Questions were answered to the patient's satisfaction.    Jaynie Collins, M.D. 07/09/2011 11:24 AM

## 2011-07-09 NOTE — Anesthesia Preprocedure Evaluation (Signed)

## 2011-07-09 NOTE — H&P (View-Only) (Signed)
History:  46 y.o. G1P1 here today for follow up evaluation of menorrhagia. She is scheduled for Hydrothermal Ablation (HTA) on 07/09/11, and is here today for preoperative endometrial biopsy.  No complaints.  The following portions of the patient's history were reviewed and updated as appropriate: allergies, current medications, past family history, past medical history, past social history, past surgical history and problem list.   Objective:  Physical Exam Blood pressure 123/67, pulse 72, height 5\' 6"  (1.676 m), weight 182 lb (82.555 kg), last menstrual period 05/29/2011. Gen: NAD Abdomen: Soft, NT, ND Pelvic: NEFG. Multiparous os, normal vaginal discharge.  ENDOMETRIAL BIOPSY     The indications for endometrial biopsy were reviewed.   Risks of the biopsy including cramping, bleeding, infection, uterine perforation, inadequate specimen and need for additional procedures  were discussed. The patient states she understands and agrees to undergo procedure today. Consent was signed. Time out was performed.   A sterile speculum was placed in the patient's vagina and the cervix was prepped with Betadine. The 3 mm pipelle was introduced into the endometrial cavity without difficulty to a depth of 9 cm, and a moderate amount of tissue was obtained and sent to pathology. The instruments were removed from the patient's vagina. Minimal bleeding from the cervix was noted. The patient tolerated the procedure well. Routine post-procedure instructions were given to the patient.   Labs and Imaging Endometrial Biopsy: Pending  Lab Results  Component Value Date   WBC 4.2 05/14/2011   HGB 7.9* 05/14/2011   HCT 27.6* 05/14/2011   MCV 71.1* 05/14/2011   PLT 272 05/14/2011  Normal TSH.  Ferritin 6.  05/23/11 Pelvic Ultrasound at [facility] : 10 week sized uterus, multiple fibroids: 0.9 cm intramural, 1.6 cm intramural posterior fundus, 3.4 cm intramural fundal.  Endometrium 4.1 mm, small nabothian cyst noted. Normal  ovaries bilaterally.  Assessment & Plan:  Will follow up results of biopsy. If normal, will proceed with surgery as planned. Routine preoperative instructions of having nothing to eat or drink after midnight on the day prior to surgery and also coming to the hospital 1 1/2 hours prior to her time of surgery were also emphasized.  Questions about HTA addressed.  Bleeding precautions reviewed; Provera prescribed to be used as needed for heavy bleeding and patient to continue iron therapy.  Return to clinic as needed.

## 2011-07-09 NOTE — Discharge Instructions (Signed)
Endometrial Ablation Endometrial ablation removes the lining of the uterus (endometrium). It is usually a same day, outpatient treatment. Ablation helps avoid major surgery (such as a hysterectomy). A hysterectomy is removal of the cervix and uterus. Endometrial ablation has less risk and complications, has a shorter recovery period and is less expensive. After endometrial ablation, most women will have little or no menstrual bleeding. You may not keep your fertility. Pregnancy is no longer likely after this procedure but if you are pre-menopausal, you still need to use a reliable method of birth control following the procedure because pregnancy can occur. REASONS TO HAVE THE PROCEDURE MAY INCLUDE:  Heavy periods.   Bleeding that is causing anemia.   Anovulatory bleeding, very irregular, bleeding.   Bleeding submucous fibroids (on the lining inside the uterus) if they are smaller than 3 centimeters.  REASONS NOT TO HAVE THE PROCEDURE MAY INCLUDE:  You wish to have more children.   You have a pre-cancerous or cancerous problem. The cause of any abnormal bleeding must be diagnosed before having the procedure.   You have pain coming from the uterus.   You have a submucus fibroid larger than 3 centimeters.   You recently had a baby.   You recently had an infection in the uterus.   You have a severe retro-flexed, tipped uterus and cannot insert the instrument to do the ablation.   You had a Cesarean section or deep major surgery on the uterus.   The inner cavity of the uterus is too large for the endometrial ablation instrument.  RISKS AND COMPLICATIONS   Perforation of the uterus.   Bleeding.   Infection of the uterus, bladder or vagina.   Injury to surrounding organs.   Cutting the cervix.   An air bubble to the lung (air embolus).   Pregnancy following the procedure.   Failure of the procedure to help the problem requiring hysterectomy.   Decreased ability to diagnose  cancer in the lining of the uterus.  BEFORE THE PROCEDURE  The lining of the uterus must be tested to make sure there is no pre-cancerous or cancer cells present.   Medications may be given to make the lining of the uterus thinner.   Ultrasound may be used to evaluate the size and look for abnormalities of the uterus.   Future pregnancy is not desired.  PROCEDURE  There are different ways to destroy the lining of the uterus.   Resectoscope - radio frequency-alternating electric current is the most common one used.   Cryotherapy - freezing the lining of the uterus.   Heated Free Liquid - heated salt (saline) solution inserted into the uterus (This was performed today).   Microwave - uses high energy microwaves in the uterus.   Thermal Balloon - a catheter with a balloon tip is inserted into the uterus and filled with heated fluid.  Your caregiver will talk with you about the method used in this clinic. They will also instruct you on the pros and cons of the procedure. Endometrial ablation is performed along with a procedure called operative hysteroscopy. A narrow viewing tube is inserted through the birth canal (vagina) and through the cervix into the uterus. A tiny camera attached to the viewing tube (hysteroscope) allows the uterine cavity to be shown on a TV monitor during surgery. Your uterus is filled with a harmless liquid to make the procedure easier. The lining of the uterus is then removed. The lining can also be removed with a resectoscope  which allows your surgeon to cut away the lining of the uterus under direct vision. Usually, you will be able to go home within an hour after the procedure. HOME CARE INSTRUCTIONS   Do not drive for 24 hours.   No tampons, douching or intercourse for 2 weeks or until your caregiver approves.   Rest at home for 24 to 48 hours. You may then resume normal activities unless told differently by your caregiver.   Take any medications your  caregiver has ordered, as directed.   Use some form of contraception if you are pre-menopausal and do not want to get pregnant.  Bleeding after the procedure is normal. It varies from light spotting and mildly watery to bloody discharge for 4 to 6 weeks. You may also have mild cramping. Only take over-the-counter or prescription medicines for pain, discomfort, or fever as directed by your caregiver. Do not use aspirin, as this may aggravate bleeding. Frequent urination during the first 24 hours is normal. You will not know how effective your surgery is until at least 3 months after the surgery. SEEK IMMEDIATE MEDICAL CARE IF:   Bleeding is heavier than a normal menstrual cycle.   An oral temperature above 102 F (38.9 C) develops.   You have increasing cramps or pains not relieved with medication or develop belly (abdominal) pain which does not seem to be related to the same area of earlier cramping and pain.   You are light headed, weak or have fainting episodes.   You have chest or leg pain.   You have abnormal vaginal discharge.   You have painful urination.  Document Released: 02/09/2004 Document Revised: 03/21/2011 Document Reviewed: 05/09/2007 [facility] Patient Information 2012 [place], [place].

## 2011-07-09 NOTE — Transfer of Care (Signed)
Immediate Anesthesia Transfer of Care Note  Patient: [name]  Procedure(s) Performed: Procedure(s) (LRB): DILATATION & CURETTAGE/HYSTEROSCOPY WITH HYDROTHERMAL ABLATION (N/A)  Patient Location: PACU  Anesthesia Type: General  Level of Consciousness: awake, alert  and oriented  Airway & Oxygen Therapy: Patient Spontanous Breathing and Patient connected to nasal cannula oxygen  Post-op Assessment: Report given to PACU RN and Post -op Vital signs reviewed and stable  Post vital signs: Reviewed and stable  Complications: No apparent anesthesia complications

## 2011-07-09 NOTE — Anesthesia Procedure Notes (Signed)
Procedure Name: LMA Insertion Date/Time: 07/09/2011 12:10 PM Performed by: Kendal Hymen Pre-anesthesia Checklist: Patient identified, Emergency Drugs available, Timeout performed, Suction available and Patient being monitored Patient Re-evaluated:Patient Re-evaluated prior to inductionOxygen Delivery Method: Circle system utilized Preoxygenation: Pre-oxygenation with 100% oxygen Intubation Type: IV induction LMA: LMA inserted LMA Size: 4.0 Tube secured with: Tape Dental Injury: Teeth and Oropharynx as per pre-operative assessment

## 2011-07-09 NOTE — Op Note (Signed)
PREOPERATIVE DIAGNOSIS:  Menorrhagia POSTOPERATIVE DIAGNOSIS: The same PROCEDURE: Dilation and Curettage, Hysteroscopy, Hydrothermal Endometrial Ablation SURGEON:  Dr. Jaynie Collins  INDICATIONS: 46 y.o. G1P1 here for scheduled surgery for abnormal uterine bleeding/menorrhagia. Risks of surgery were discussed with the patient including but not limited to: bleeding which may require transfusion; infection which may require antibiotics; injury to uterus leading to risk of injury to surrounding intraperitoneal organs, burn injury to vagina or other organs, need for additional procedures including laparoscopy or laparotomy, and other postoperative/anesthesia complications. Patient was informed that there is a high likelihood of success of controlling her symptoms; however less than 5% of patients may require further intervention.  Written informed consent was obtained.    FINDINGS:  A 11 week size uterus.  Diffuse polypoid and proliferative endometrium.  Normal ostia bilaterally.  ANESTHESIA:   General, paracervical block. INTRAVENOUS FLUIDS:  1100 ml of LR ESTIMATED BLOOD LOSS:  20 ml SPECIMENS: Endometrial curettings sent to pathology COMPLICATIONS:  None immediate.  PROCEDURE DETAILS:  The patient was then taken to the operating room where general anesthesia was administered and was found to be adequate.  After an adequate timeout was performed, she was placed in the dorsal lithotomy position and examined; then prepped and draped in the sterile manner.   Her bladder was catheterized for clear, yellow urine. A speculum was then placed in the patient's vagina and a single tooth tenaculum was applied to the anterior lip of the cervix.   A paracervical block using 30 ml of 0.5% Marcaine was administered.  The cervix was sounded to 11 cm and dilated manually with metal dilators to accommodate the hydrothermal ablation hysteroscopic apparatus.  Once the cervix was dilated, a sharp curettage was then  performed to obtain a moderate amount of endometrial curettings.  The hysteroscope was inserted under direct visualization using normal saline as a suspension medium.  The uterine cavity was carefully examined, both ostia were recognized, and diffusely polypoid and proliferative endometrium was noted.   The hydrothermal ablation was then carried out as per protocol.   Complete ablation of the endometrium was observed and the hysteroscope was removed under direct visualization. .  No complications were observed.  The tenaculum was removed from the anterior lip of the cervix, and the vaginal speculum was removed after noting good hemostasis.  The patient tolerated the procedure well and was taken to the recovery area awake, extubated and in stable condition.  The patient will be discharged to home as per PACU criteria.  Routine postoperative instructions given.  She was prescribed Percocet, Ibuprofen and Colace.  She will follow up in the clinic in 2-3 weeks  for postoperative evaluation.

## 2011-07-10 ENCOUNTER — Ambulatory Visit: Payer: Medicaid Other | Admitting: Obstetrics & Gynecology

## 2011-07-10 NOTE — Anesthesia Postprocedure Evaluation (Signed)
  Anesthesia Post-op Note  Patient: [name]  Procedure(s) Performed: Procedure(s) (LRB): DILATATION & CURETTAGE/HYSTEROSCOPY WITH HYDROTHERMAL ABLATION (N/A)  Patient is awake and responsive. Pain and nausea are reasonably well controlled. Vital signs are stable and clinically acceptable. Oxygen saturation is clinically acceptable. There are no apparent anesthetic complications at this time. Patient is ready for discharge.

## 2011-07-25 ENCOUNTER — Ambulatory Visit (INDEPENDENT_AMBULATORY_CARE_PROVIDER_SITE_OTHER): Payer: Medicaid Other | Admitting: Obstetrics & Gynecology

## 2011-07-25 ENCOUNTER — Encounter: Payer: Self-pay | Admitting: Obstetrics & Gynecology

## 2011-07-25 VITALS — BP 105/76 | HR 78 | Ht 66.0 in | Wt 181.0 lb

## 2011-07-25 DIAGNOSIS — Z09 Encounter for follow-up examination after completed treatment for conditions other than malignant neoplasm: Secondary | ICD-10-CM

## 2011-07-25 DIAGNOSIS — N920 Excessive and frequent menstruation with regular cycle: Secondary | ICD-10-CM

## 2011-07-25 NOTE — Progress Notes (Signed)
Patient is here for post op.  She is doing well except for some increased yellow discharge.

## 2011-07-25 NOTE — Progress Notes (Signed)
  Subjective:     [name] is a 46 y.o. female who presents to the clinic after Dilation and Curettage, Hysteroscopy, Hydrothermal Endometrial Ablation on 07/09/11 for menorrhagia.  Benign pathology.  She reports having expected yellow vaginal discharge, no bleeding.  Eating a regular diet without difficulty. Bowel movements are normal. The patient is not having any pain.  The following portions of the patient's history were reviewed and updated as appropriate: allergies, current medications, past family history, past medical history, past social history, past surgical history and problem list.  Review of Systems Pertinent items are noted in HPI.    Objective:    BP 105/76  Pulse 78  Ht 5\' 6"  (1.676 m)  Wt 181 lb (82.101 kg)  BMI 29.21 kg/m2  LMP 06/17/2011 General:  alert and no distress  Abdomen: soft, bowel sounds active, non-tender, no abnormal masses  Pelvic:   yellow discharge consistent with post-ablation discharge, closer cervix, no CMT, no uterine or adnexal tenderness      4/36/12 Endometrium, curettage - BENIGN SECRETORY ENDOMETRIUM, NO ATYPIA, HYPERPLASIA OR MALIGNANCY. - BENIGN ENDOCERVICAL MUCOSA WITH NO EVIDENCE OF DYSPLASIA OR MALIGNANCY. Assessment:    Doing well postoperatively. Operative findings again reviewed. Pathology report discussed.    Plan:    1. Continue any current medications. 2. Pelvic rest for 4 weeks post surgery 3. Activity restrictions: none 4. Anticipated return to work: not applicable. 5. Follow up: 04/2012 for annual exam

## 2011-07-25 NOTE — Patient Instructions (Signed)
Return to clinic for any scheduled appointments or for any gynecologic concerns as needed.   

## 2011-10-24 ENCOUNTER — Ambulatory Visit (INDEPENDENT_AMBULATORY_CARE_PROVIDER_SITE_OTHER): Payer: Medicaid Other | Admitting: Obstetrics & Gynecology

## 2011-10-24 ENCOUNTER — Encounter: Payer: Self-pay | Admitting: Obstetrics & Gynecology

## 2011-10-24 VITALS — BP 102/62 | HR 86 | Ht 66.0 in

## 2011-10-24 DIAGNOSIS — N760 Acute vaginitis: Secondary | ICD-10-CM

## 2011-10-24 DIAGNOSIS — A499 Bacterial infection, unspecified: Secondary | ICD-10-CM

## 2011-10-24 DIAGNOSIS — N898 Other specified noninflammatory disorders of vagina: Secondary | ICD-10-CM

## 2011-10-24 NOTE — Progress Notes (Signed)
History:  [name] is a 46 y.o. G1P1 female who presents to the clinic with report of malodorous vaginal discharge since undergoing Hydrothermal Endometrial Ablation on 07/09/11 for menorrhagia. She reports that the discharge is white-yellow colored, not brown and copious like it was immediately after surgery.  Denies douching, using scented soaps or doing any other activities not suitable for proper vulvar hygiene. No unprotected sexual intercourse.  No bleeding or other GYN concerns.   The following portions of the patient's history were reviewed and updated as appropriate: allergies, current medications, past family history, past medical history, past social history, past surgical history and problem list.  Review of Systems:  Pertinent items are noted in HPI.  Objective:  Physical Exam Blood pressure 102/62, pulse 86, height 5\' 6"  (1.676 m). Gen: NAD Abd: Soft, nontender and nondistended Pelvic: Normal appearing external genitalia; normal appearing vaginal mucosa and cervix.  Scant thin white discharge, mild odor noted, wet prep and GC/Chalm samples obtained.  Small uterus, no other palpable masses, no uterine or adnexal tenderness  Assessment & Plan:  Will follow up results and manage accordingly Encouraged use of RepHresh products to help promote adequate vaginal pH Proper vulvar hygiene emphasized.

## 2011-10-24 NOTE — Patient Instructions (Signed)
Return to clinic for any scheduled appointments or for any gynecologic concerns as needed.   

## 2011-10-25 LAB — WET PREP, GENITAL: Yeast Wet Prep HPF POC: NONE SEEN

## 2011-10-25 LAB — GC/CHLAMYDIA PROBE AMP, GENITAL: GC Probe Amp, Genital: NEGATIVE

## 2011-10-29 MED ORDER — TINIDAZOLE 500 MG PO TABS: 2.0000 g | ORAL_TABLET | Freq: Every day | ORAL | Status: AC

## 2011-10-29 NOTE — Progress Notes (Signed)
Wet prep showed clue cells, Tinidazole e-prescribed. Patient informed of diagnosis and treatment, and to go pick up the prescription. She was counseled about antabuse reaction that can happen with alcohol. She was also told to call the clinic with any further questions or further concerns.

## 2011-10-29 NOTE — Addendum Note (Signed)
Addended by: Jaynie Collins A on: 10/29/2011 01:58 PM   Modules accepted: Orders

## 2012-04-27 ENCOUNTER — Other Ambulatory Visit: Payer: Self-pay | Admitting: Obstetrics & Gynecology

## 2012-04-27 DIAGNOSIS — Z1231 Encounter for screening mammogram for malignant neoplasm of breast: Secondary | ICD-10-CM

## 2012-05-06 ENCOUNTER — Ambulatory Visit (HOSPITAL_COMMUNITY): Payer: Medicaid Other

## 2012-05-21 ENCOUNTER — Other Ambulatory Visit: Payer: Self-pay | Admitting: Obstetrics & Gynecology

## 2012-05-21 DIAGNOSIS — Z1231 Encounter for screening mammogram for malignant neoplasm of breast: Secondary | ICD-10-CM

## 2012-05-28 ENCOUNTER — Ambulatory Visit (HOSPITAL_COMMUNITY): Payer: Medicaid Other

## 2012-06-02 ENCOUNTER — Ambulatory Visit (HOSPITAL_COMMUNITY)
Admission: RE | Admit: 2012-06-02 | Discharge: 2012-06-02 | Disposition: A | Discharge status: Home / Self Care | Payer: BC Managed Care – PPO | Source: Ambulatory Visit | Attending: Obstetrics & Gynecology | Admitting: Obstetrics & Gynecology

## 2012-06-02 DIAGNOSIS — Z1231 Encounter for screening mammogram for malignant neoplasm of breast: Secondary | ICD-10-CM | POA: Insufficient documentation

## 2012-07-09 ENCOUNTER — Other Ambulatory Visit: Payer: Self-pay | Admitting: Obstetrics & Gynecology

## 2012-08-17 ENCOUNTER — Emergency Department: Payer: Self-pay | Admitting: Emergency Medicine

## 2012-10-03 ENCOUNTER — Emergency Department: Payer: Self-pay | Admitting: Emergency Medicine

## 2012-11-24 ENCOUNTER — Ambulatory Visit: Payer: BC Managed Care – PPO | Admitting: Obstetrics & Gynecology

## 2012-11-26 ENCOUNTER — Encounter: Payer: Self-pay | Admitting: Obstetrics & Gynecology

## 2012-11-26 ENCOUNTER — Other Ambulatory Visit (HOSPITAL_COMMUNITY)
Admission: RE | Admit: 2012-11-26 | Discharge: 2012-11-26 | Disposition: A | Discharge status: Home / Self Care | Payer: Medicaid Other | Source: Ambulatory Visit | Attending: Obstetrics & Gynecology | Admitting: Obstetrics & Gynecology

## 2012-11-26 ENCOUNTER — Ambulatory Visit (INDEPENDENT_AMBULATORY_CARE_PROVIDER_SITE_OTHER): Payer: Medicaid Other | Admitting: Obstetrics & Gynecology

## 2012-11-26 VITALS — BP 79/56 | HR 78 | Ht 66.0 in | Wt 187.0 lb

## 2012-11-26 DIAGNOSIS — Z113 Encounter for screening for infections with a predominantly sexual mode of transmission: Secondary | ICD-10-CM | POA: Insufficient documentation

## 2012-11-26 DIAGNOSIS — N760 Acute vaginitis: Secondary | ICD-10-CM

## 2012-11-26 DIAGNOSIS — Z0000 Encounter for general adult medical examination without abnormal findings: Secondary | ICD-10-CM

## 2012-11-26 DIAGNOSIS — Z1151 Encounter for screening for human papillomavirus (HPV): Secondary | ICD-10-CM | POA: Insufficient documentation

## 2012-11-26 DIAGNOSIS — Z01419 Encounter for gynecological examination (general) (routine) without abnormal findings: Secondary | ICD-10-CM | POA: Insufficient documentation

## 2012-11-26 DIAGNOSIS — B9689 Other specified bacterial agents as the cause of diseases classified elsewhere: Secondary | ICD-10-CM

## 2012-11-26 DIAGNOSIS — N898 Other specified noninflammatory disorders of vagina: Secondary | ICD-10-CM

## 2012-11-26 LAB — COMPREHENSIVE METABOLIC PANEL
AST: 16 U/L (ref 0–37)
Albumin: 4.3 g/dL (ref 3.5–5.2)
Alkaline Phosphatase: 50 U/L (ref 39–117)
Potassium: 4 mEq/L (ref 3.5–5.3)
Sodium: 138 mEq/L (ref 135–145)
Total Protein: 7.4 g/dL (ref 6.0–8.3)

## 2012-11-26 LAB — LIPID PANEL: LDL Cholesterol: 99 mg/dL (ref 0–99)

## 2012-11-26 LAB — TSH: TSH: 1.123 u[IU]/mL (ref 0.350–4.500)

## 2012-11-26 NOTE — Patient Instructions (Signed)
Preventive Care for Adults, Female A healthy lifestyle and preventive care can promote health and wellness. Preventive health guidelines for women include the following key practices.  A routine yearly physical is a good way to check with your caregiver about your health and preventive screening. It is a chance to share any concerns and updates on your health, and to receive a thorough exam.  Visit your dentist for a routine exam and preventive care every 6 months. Brush your teeth twice a day and floss once a day. Good oral hygiene prevents tooth decay and gum disease.  The frequency of eye exams is based on your age, health, family medical history, use of contact lenses, and other factors. Follow your caregiver's recommendations for frequency of eye exams.  Eat a healthy diet. Foods like vegetables, fruits, whole grains, low-fat dairy products, and lean protein foods contain the nutrients you need without too many calories. Decrease your intake of foods high in solid fats, added sugars, and salt. Eat the right amount of calories for you.Get information about a proper diet from your caregiver, if necessary.  Regular physical exercise is one of the most important things you can do for your health. Most adults should get at least 150 minutes of moderate-intensity exercise (any activity that increases your heart rate and causes you to sweat) each week. In addition, most adults need muscle-strengthening exercises on 2 or more days a week.  Maintain a healthy weight. The body mass index (BMI) is a screening tool to identify possible weight problems. It provides an estimate of body fat based on height and weight. Your caregiver can help determine your BMI, and can help you achieve or maintain a healthy weight.For adults 20 years and older:  A BMI below 18.5 is considered underweight.  A BMI of 18.5 to 24.9 is normal.  A BMI of 25 to 29.9 is considered overweight.  A BMI of 30 and above is  considered obese.  Maintain normal blood lipids and cholesterol levels by exercising and minimizing your intake of saturated fat. Eat a balanced diet with plenty of fruit and vegetables. Blood tests for lipids and cholesterol should begin at age 41 and be repeated every 5 years. If your lipid or cholesterol levels are high, you are over 50, or you are at high risk for heart disease, you may need your cholesterol levels checked more frequently.Ongoing high lipid and cholesterol levels should be treated with medicines if diet and exercise are not effective.  If you smoke, find out from your caregiver how to quit. If you do not use tobacco, do not start.  If you are pregnant, do not drink alcohol. If you are breastfeeding, be very cautious about drinking alcohol. If you are not pregnant and choose to drink alcohol, do not exceed 1 drink per day. One drink is considered to be 12 ounces (355 mL) of beer, 5 ounces (148 mL) of wine, or 1.5 ounces (44 mL) of liquor.  Avoid use of street drugs. Do not share needles with anyone. Ask for help if you need support or instructions about stopping the use of drugs.  High blood pressure causes heart disease and increases the risk of stroke. Your blood pressure should be checked at least every 1 to 2 years. Ongoing high blood pressure should be treated with medicines if weight loss and exercise are not effective.  If you are 65 to 47 years old, ask your caregiver if you should take aspirin to prevent strokes.  Diabetes  screening involves taking a blood sample to check your fasting blood sugar level. This should be done once every 3 years, after age 45, if you are within normal weight and without risk factors for diabetes. Testing should be considered at a younger age or be carried out more frequently if you are overweight and have at least 1 risk factor for diabetes.  Breast cancer screening is essential preventive care for women. You should practice "breast  self-awareness." This means understanding the normal appearance and feel of your breasts and may include breast self-examination. Any changes detected, no matter how small, should be reported to a caregiver. Women in their 20s and 30s should have a clinical breast exam (CBE) by a caregiver as part of a regular health exam every 1 to 3 years. After age 40, women should have a CBE every year. Starting at age 40, women should consider having a mammography (breast X-ray test) every year. Women who have a family history of breast cancer should talk to their caregiver about genetic screening. Women at a high risk of breast cancer should talk to their caregivers about having magnetic resonance imaging (MRI) and a mammography every year.  The Pap test is a screening test for cervical cancer. A Pap test can show cell changes on the cervix that might become cervical cancer if left untreated. A Pap test is a procedure in which cells are obtained and examined from the lower end of the uterus (cervix).  Women should have a Pap test starting at age 21.  Between ages 21 and 29, Pap tests should be repeated every 2 years.  Beginning at age 30, you should have a Pap test every 3 years as long as the past 3 Pap tests have been normal.  Some women have medical problems that increase the chance of getting cervical cancer. Talk to your caregiver about these problems. It is especially important to talk to your caregiver if a new problem develops soon after your last Pap test. In these cases, your caregiver may recommend more frequent screening and Pap tests.  The above recommendations are the same for women who have or have not gotten the vaccine for human papillomavirus (HPV).  If you had a hysterectomy for a problem that was not cancer or a condition that could lead to cancer, then you no longer need Pap tests. Even if you no longer need a Pap test, a regular exam is a good idea to make sure no other problems are  starting.  If you are between ages 65 and 70, and you have had normal Pap tests going back 10 years, you no longer need Pap tests. Even if you no longer need a Pap test, a regular exam is a good idea to make sure no other problems are starting.  If you have had past treatment for cervical cancer or a condition that could lead to cancer, you need Pap tests and screening for cancer for at least 20 years after your treatment.  If Pap tests have been discontinued, risk factors (such as a new sexual partner) need to be reassessed to determine if screening should be resumed.  The HPV test is an additional test that may be used for cervical cancer screening. The HPV test looks for the virus that can cause the cell changes on the cervix. The cells collected during the Pap test can be tested for HPV. The HPV test could be used to screen women aged 30 years and older, and should   be used in women of any age who have unclear Pap test results. After the age of 30, women should have HPV testing at the same frequency as a Pap test.  Colorectal cancer can be detected and often prevented. Most routine colorectal cancer screening begins at the age of 50 and continues through age 75. However, your caregiver may recommend screening at an earlier age if you have risk factors for colon cancer. On a yearly basis, your caregiver may provide home test kits to check for hidden blood in the stool. Use of a small camera at the end of a tube, to directly examine the colon (sigmoidoscopy or colonoscopy), can detect the earliest forms of colorectal cancer. Talk to your caregiver about this at age 50, when routine screening begins. Direct examination of the colon should be repeated every 5 to 10 years through age 75, unless early forms of pre-cancerous polyps or small growths are found.  Hepatitis C blood testing is recommended for all people born from 1945 through 1965 and any individual with known risks for hepatitis C.  Practice  safe sex. Use condoms and avoid high-risk sexual practices to reduce the spread of sexually transmitted infections (STIs). STIs include gonorrhea, chlamydia, syphilis, trichomonas, herpes, HPV, and human immunodeficiency virus (HIV). Herpes, HIV, and HPV are viral illnesses that have no cure. They can result in disability, cancer, and death. Sexually active women aged 25 and younger should be checked for chlamydia. Older women with new or multiple partners should also be tested for chlamydia. Testing for other STIs is recommended if you are sexually active and at increased risk.  Osteoporosis is a disease in which the bones lose minerals and strength with aging. This can result in serious bone fractures. The risk of osteoporosis can be identified using a bone density scan. Women ages 65 and over and women at risk for fractures or osteoporosis should discuss screening with their caregivers. Ask your caregiver whether you should take a calcium supplement or vitamin D to reduce the rate of osteoporosis.  Menopause can be associated with physical symptoms and risks. Hormone replacement therapy is available to decrease symptoms and risks. You should talk to your caregiver about whether hormone replacement therapy is right for you.  Use sunscreen with sun protection factor (SPF) of 30 or more. Apply sunscreen liberally and repeatedly throughout the day. You should seek shade when your shadow is shorter than you. Protect yourself by wearing long sleeves, pants, a wide-brimmed hat, and sunglasses year round, whenever you are outdoors.  Once a month, do a whole body skin exam, using a mirror to look at the skin on your back. Notify your caregiver of new moles, moles that have irregular borders, moles that are larger than a pencil eraser, or moles that have changed in shape or color.  Stay current with required immunizations.  Influenza. You need a dose every fall (or winter). The composition of the flu vaccine  changes each year, so being vaccinated once is not enough.  Pneumococcal polysaccharide. You need 1 to 2 doses if you smoke cigarettes or if you have certain chronic medical conditions. You need 1 dose at age 65 (or older) if you have never been vaccinated.  Tetanus, diphtheria, pertussis (Tdap, Td). Get 1 dose of Tdap vaccine if you are younger than age 65, are over 65 and have contact with an infant, are a healthcare worker, are pregnant, or simply want to be protected from whooping cough. After that, you need a Td   booster dose every 10 years. Consult your caregiver if you have not had at least 3 tetanus and diphtheria-containing shots sometime in your life or have a deep or dirty wound.  HPV. You need this vaccine if you are a woman age 26 or younger. The vaccine is given in 3 doses over 6 months.  Measles, mumps, rubella (MMR). You need at least 1 dose of MMR if you were born in 1957 or later. You may also need a second dose.  Meningococcal. If you are age 19 to 21 and a first-year college student living in a residence hall, or have one of several medical conditions, you need to get vaccinated against meningococcal disease. You may also need additional booster doses.  Zoster (shingles). If you are age 60 or older, you should get this vaccine.  Varicella (chickenpox). If you have never had chickenpox or you were vaccinated but received only 1 dose, talk to your caregiver to find out if you need this vaccine.  Hepatitis A. You need this vaccine if you have a specific risk factor for hepatitis A virus infection or you simply wish to be protected from this disease. The vaccine is usually given as 2 doses, 6 to 18 months apart.  Hepatitis B. You need this vaccine if you have a specific risk factor for hepatitis B virus infection or you simply wish to be protected from this disease. The vaccine is given in 3 doses, usually over 6 months. Preventive Services / Frequency Ages 19 to 39  Blood  pressure check.** / Every 1 to 2 years.  Lipid and cholesterol check.** / Every 5 years beginning at age 20.  Clinical breast exam.** / Every 3 years for women in their 20s and 30s.  Pap test.** / Every 2 years from ages 21 through 29. Every 3 years starting at age 30 through age 65 or 70 with a history of 3 consecutive normal Pap tests.  HPV screening.** / Every 3 years from ages 30 through ages 65 to 70 with a history of 3 consecutive normal Pap tests.  Hepatitis C blood test.** / For any individual with known risks for hepatitis C.  Skin self-exam. / Monthly.  Influenza immunization.** / Every year.  Pneumococcal polysaccharide immunization.** / 1 to 2 doses if you smoke cigarettes or if you have certain chronic medical conditions.  Tetanus, diphtheria, pertussis (Tdap, Td) immunization. / A one-time dose of Tdap vaccine. After that, you need a Td booster dose every 10 years.  HPV immunization. / 3 doses over 6 months, if you are 26 and younger.  Measles, mumps, rubella (MMR) immunization. / You need at least 1 dose of MMR if you were born in 1957 or later. You may also need a second dose.  Meningococcal immunization. / 1 dose if you are age 19 to 21 and a first-year college student living in a residence hall, or have one of several medical conditions, you need to get vaccinated against meningococcal disease. You may also need additional booster doses.  Varicella immunization.** / Consult your caregiver.  Hepatitis A immunization.** / Consult your caregiver. 2 doses, 6 to 18 months apart.  Hepatitis B immunization.** / Consult your caregiver. 3 doses usually over 6 months. Ages 40 to 64  Blood pressure check.** / Every 1 to 2 years.  Lipid and cholesterol check.** / Every 5 years beginning at age 20.  Clinical breast exam.** / Every year after age 40.  Mammogram.** / Every year beginning at age 40   and continuing for as long as you are in good health. Consult with your  caregiver.  Pap test.** / Every 3 years starting at age 30 through age 65 or 70 with a history of 3 consecutive normal Pap tests.  HPV screening.** / Every 3 years from ages 30 through ages 65 to 70 with a history of 3 consecutive normal Pap tests.  Fecal occult blood test (FOBT) of stool. / Every year beginning at age 50 and continuing until age 75. You may not need to do this test if you get a colonoscopy every 10 years.  Flexible sigmoidoscopy or colonoscopy.** / Every 5 years for a flexible sigmoidoscopy or every 10 years for a colonoscopy beginning at age 50 and continuing until age 75.  Hepatitis C blood test.** / For all people born from 1945 through 1965 and any individual with known risks for hepatitis C.  Skin self-exam. / Monthly.  Influenza immunization.** / Every year.  Pneumococcal polysaccharide immunization.** / 1 to 2 doses if you smoke cigarettes or if you have certain chronic medical conditions.  Tetanus, diphtheria, pertussis (Tdap, Td) immunization.** / A one-time dose of Tdap vaccine. After that, you need a Td booster dose every 10 years.  Measles, mumps, rubella (MMR) immunization. / You need at least 1 dose of MMR if you were born in 1957 or later. You may also need a second dose.  Varicella immunization.** / Consult your caregiver.  Meningococcal immunization.** / Consult your caregiver.  Hepatitis A immunization.** / Consult your caregiver. 2 doses, 6 to 18 months apart.  Hepatitis B immunization.** / Consult your caregiver. 3 doses, usually over 6 months. Ages 65 and over  Blood pressure check.** / Every 1 to 2 years.  Lipid and cholesterol check.** / Every 5 years beginning at age 20.  Clinical breast exam.** / Every year after age 40.  Mammogram.** / Every year beginning at age 40 and continuing for as long as you are in good health. Consult with your caregiver.  Pap test.** / Every 3 years starting at age 30 through age 65 or 70 with a 3  consecutive normal Pap tests. Testing can be stopped between 65 and 70 with 3 consecutive normal Pap tests and no abnormal Pap or HPV tests in the past 10 years.  HPV screening.** / Every 3 years from ages 30 through ages 65 or 70 with a history of 3 consecutive normal Pap tests. Testing can be stopped between 65 and 70 with 3 consecutive normal Pap tests and no abnormal Pap or HPV tests in the past 10 years.  Fecal occult blood test (FOBT) of stool. / Every year beginning at age 50 and continuing until age 75. You may not need to do this test if you get a colonoscopy every 10 years.  Flexible sigmoidoscopy or colonoscopy.** / Every 5 years for a flexible sigmoidoscopy or every 10 years for a colonoscopy beginning at age 50 and continuing until age 75.  Hepatitis C blood test.** / For all people born from 1945 through 1965 and any individual with known risks for hepatitis C.  Osteoporosis screening.** / A one-time screening for women ages 65 and over and women at risk for fractures or osteoporosis.  Skin self-exam. / Monthly.  Influenza immunization.** / Every year.  Pneumococcal polysaccharide immunization.** / 1 dose at age 65 (or older) if you have never been vaccinated.  Tetanus, diphtheria, pertussis (Tdap, Td) immunization. / A one-time dose of Tdap vaccine if you are over   65 and have contact with an infant, are a Research scientist (physical sciences), or simply want to be protected from whooping cough. After that, you need a Td booster dose every 10 years.  Varicella immunization.** / Consult your caregiver.  Meningococcal immunization.** / Consult your caregiver.  Hepatitis A immunization.** / Consult your caregiver. 2 doses, 6 to 18 months apart.  Hepatitis B immunization.** / Check with your caregiver. 3 doses, usually over 6 months. ** Family history and personal history of risk and conditions may change your caregiver's recommendations. Document Released: 05/28/2001 Document Revised: 06/24/2011  Document Reviewed: 08/27/2010 [facility] Patient Information 2014 [place], [place].   Thank you for enrolling in MyChart. Please follow the instructions below to securely access your online medical record. MyChart allows you to send messages to your doctor, view your test results, manage appointments, and more.   How Do I Sign Up? 1. In your Internet browser, go to [employer] and enter [email or web address]. 2. Click on the Sign Up Now link in the Sign In box. You will see the New Member Sign Up page. 3. Enter your MyChart Access Code exactly as it appears below. You will not need to use this code after you've completed the sign-up process. If you do not sign up before the expiration date, you must request a new code. MyChart Access Code: [email or web address] Expires: 12/26/2012  8:42 AM  4. Enter your Social Security Number ([id number]) and Date of Birth (mm/dd/yyyy) as indicated and click Submit. You will be taken to the next sign-up page. 5. Create a MyChart ID. This will be your MyChart login ID and cannot be changed, so think of one that is secure and easy to remember. 6. Create a MyChart password. You can change your password at any time. 7. Enter your Password Reset Question and Answer. This can be used at a later time if you forget your password.  8. Enter your e-mail address. You will receive e-mail notification when new information is available in MyChart. 9. Click Sign Up. You can now view your medical record.   Additional Information Remember, MyChart is NOT to be used for urgent needs. For medical emergencies, dial 911.

## 2012-11-26 NOTE — Progress Notes (Signed)
  Subjective:     [name] is a 47 y.o. G1P1 female and is here for a comprehensive physical exam. The patient reports foul-smelling vaginal discharge x 1 week, has a history of frequent BV episodes.  Also desires STI testing. No bleeding since HTA endometrial ablation on 07/09/11.  No other GYN symptoms.  History   Social History  . Marital Status: Single    Spouse Name: N/A    Number of Children: N/A  . Years of Education: N/A   Occupational History  . Not on file.   Social History Main Topics  . Smoking status: Never Smoker   . Smokeless tobacco: Never Used  . Alcohol Use: No  . Drug Use: No  . Sexual Activity: Yes    Partners: Male    Birth Control/ Protection: None     Comment: boyfriend has had a vasectomy per pt.   Other Topics Concern  . Not on file   Social History Narrative  . No narrative on file   Health Maintenance  Topic Date Due  . Tetanus/tdap  04/11/1985  . Influenza Vaccine  12/14/2012  . Pap Smear  05/13/2014    The following portions of the patient's history were reviewed and updated as appropriate: allergies, current medications, past family history, past medical history, past social history, past surgical history and problem list.  Normal mammogram on 06/03/2012  Review of Systems Pertinent items are noted in HPI.   Objective:   BP 79/56  Pulse 78  Ht 5\' 6"  (1.676 m)  Wt 187 lb (84.823 kg)  BMI 30.2 kg/m2 GENERAL: Well-developed, well-nourished female in no acute distress.  HEENT: Normocephalic, atraumatic. Sclerae anicteric.  NECK: Supple. Normal thyroid.  LUNGS: Clear to auscultation bilaterally.  HEART: Regular rate and rhythm. BREASTS: Symmetric in size. No masses, skin changes, nipple drainage, or lymphadenopathy. ABDOMEN: Soft, nontender, nondistended. No organomegaly. PELVIC: Normal external female genitalia. Vagina is pink and rugated.  White discharge seen, no odor perceived. Normal cervix contour. Pap smear obtained. Uterus  is normal in size. No adnexal mass or tenderness.  EXTREMITIES: No cyanosis, clubbing, or edema, 2+ distal pulses.   Assessment:   Healthy female exam. Desires STI screen Vaginal discharge   Plan:   Pap done, ancillary testing done for STI testing and vaginal discharge testing.  Will follow up results and manage accordingly. Serum STI testing also done, will follow up results and manage accordingly. Routine preventative health maintenance measures emphasized

## 2012-11-27 LAB — CBC
Hemoglobin: 12.2 g/dL (ref 12.0–15.0)
MCHC: 32.4 g/dL (ref 30.0–36.0)
RDW: 13.2 % (ref 11.5–15.5)
WBC: 4.4 10*3/uL (ref 4.0–10.5)

## 2012-11-27 LAB — HIV ANTIBODY (ROUTINE TESTING W REFLEX): HIV: NONREACTIVE

## 2012-11-27 LAB — HEPATITIS C ANTIBODY: HCV Ab: NEGATIVE

## 2012-12-01 MED ORDER — TINIDAZOLE 500 MG PO TABS: 2.0000 g | ORAL_TABLET | Freq: Every day | ORAL | Status: DC

## 2012-12-01 MED ORDER — FLUCONAZOLE 150 MG PO TABS: 150.0000 mg | ORAL_TABLET | Freq: Once | ORAL | Status: DC

## 2012-12-01 NOTE — Addendum Note (Signed)
Addended by: Jaynie Collins A on: 12/01/2012 02:16 PM   Modules accepted: Orders

## 2012-12-12 ENCOUNTER — Emergency Department: Payer: Self-pay | Admitting: Emergency Medicine

## 2013-05-17 ENCOUNTER — Other Ambulatory Visit: Payer: Self-pay | Admitting: Obstetrics & Gynecology

## 2013-05-17 DIAGNOSIS — Z1231 Encounter for screening mammogram for malignant neoplasm of breast: Secondary | ICD-10-CM

## 2013-06-03 ENCOUNTER — Ambulatory Visit (HOSPITAL_COMMUNITY)
Admission: RE | Admit: 2013-06-03 | Discharge: 2013-06-03 | Disposition: A | Discharge status: Home / Self Care | Payer: BC Managed Care – PPO | Source: Ambulatory Visit | Attending: Obstetrics & Gynecology | Admitting: Obstetrics & Gynecology

## 2013-06-03 DIAGNOSIS — Z1231 Encounter for screening mammogram for malignant neoplasm of breast: Secondary | ICD-10-CM | POA: Insufficient documentation

## 2013-06-30 ENCOUNTER — Other Ambulatory Visit: Payer: Self-pay | Admitting: Obstetrics & Gynecology

## 2013-08-02 ENCOUNTER — Encounter: Payer: Self-pay | Admitting: Family Medicine

## 2013-08-02 ENCOUNTER — Ambulatory Visit (INDEPENDENT_AMBULATORY_CARE_PROVIDER_SITE_OTHER): Payer: BC Managed Care – PPO | Admitting: Family Medicine

## 2013-08-02 VITALS — BP 120/79 | HR 88 | Ht 67.0 in | Wt 186.0 lb

## 2013-08-02 DIAGNOSIS — N760 Acute vaginitis: Secondary | ICD-10-CM

## 2013-08-02 DIAGNOSIS — N898 Other specified noninflammatory disorders of vagina: Secondary | ICD-10-CM

## 2013-08-02 DIAGNOSIS — B9689 Other specified bacterial agents as the cause of diseases classified elsewhere: Secondary | ICD-10-CM

## 2013-08-02 DIAGNOSIS — A499 Bacterial infection, unspecified: Secondary | ICD-10-CM

## 2013-08-02 MED ORDER — METRONIDAZOLE 500 MG PO TABS: 500.0000 mg | ORAL_TABLET | Freq: Two times a day (BID) | ORAL | Status: AC

## 2013-08-02 NOTE — Patient Instructions (Signed)

## 2013-08-02 NOTE — Progress Notes (Signed)
    Subjective:    Patient ID: [name] is a 48 y.o. female presenting with Vaginal Bleeding  on 08/02/2013  HPI: Bleeding approx. 1 wk ago for 3 days.  Spotting, no cramping.  Now with foul smelling discharge. H/o BV in the past.  Negative STD workup in past year.  Denies fever, chills, abd. Pain, N/V.  Review of Systems  Constitutional: Negative for fever and chills.  Gastrointestinal: Positive for abdominal pain. Negative for nausea and vomiting.  Genitourinary: Positive for vaginal discharge and menstrual problem (spotting following HTA ablation).  Musculoskeletal: Negative for back pain.      Objective:    BP 120/79  Pulse 88  Ht 5\' 7"  (1.702 m)  Wt 186 lb (84.369 kg)  BMI 29.12 kg/m2 Physical Exam  Vitals reviewed. Constitutional: She is oriented to person, place, and time. She appears well-developed and well-nourished. No distress.  HENT:  Head: Normocephalic and atraumatic.  Eyes: No scleral icterus.  Neck: Neck supple.  Cardiovascular: Normal rate.   Pulmonary/Chest: Breath sounds normal.  Abdominal: There is no tenderness.  Genitourinary: Vaginal discharge found.  Neurological: She is alert and oriented to person, place, and time.  Psychiatric: She has a normal mood and affect.        Assessment & Plan:   Vaginal discharge - Plan: Wet prep, genital  BV (bacterial vaginosis) - Plan: metroNIDAZOLE (FLAGYL) 500 MG tablet   Return if symptoms worsen or fail to improve.

## 2013-08-03 LAB — WET PREP, GENITAL
TRICH WET PREP: NONE SEEN
WBC, Wet Prep HPF POC: NONE SEEN
YEAST WET PREP: NONE SEEN

## 2013-10-26 ENCOUNTER — Telehealth: Payer: Self-pay | Admitting: *Deleted

## 2013-10-26 DIAGNOSIS — B009 Herpesviral infection, unspecified: Secondary | ICD-10-CM

## 2013-10-26 MED ORDER — VALACYCLOVIR HCL 500 MG PO TABS: 500.0000 mg | ORAL_TABLET | Freq: Every day | ORAL | Status: DC | PRN

## 2013-10-26 NOTE — Telephone Encounter (Signed)
Patient needs a refill of valtrex.

## 2013-12-13 ENCOUNTER — Ambulatory Visit (INDEPENDENT_AMBULATORY_CARE_PROVIDER_SITE_OTHER): Payer: Self-pay | Admitting: Obstetrics & Gynecology

## 2013-12-13 ENCOUNTER — Other Ambulatory Visit (HOSPITAL_COMMUNITY)
Admission: RE | Admit: 2013-12-13 | Discharge: 2013-12-13 | Disposition: A | Discharge status: Home / Self Care | Payer: Medicaid Other | Source: Ambulatory Visit | Attending: Obstetrics & Gynecology | Admitting: Obstetrics & Gynecology

## 2013-12-13 ENCOUNTER — Encounter: Payer: Self-pay | Admitting: Obstetrics & Gynecology

## 2013-12-13 VITALS — BP 116/65 | HR 89 | Ht 66.5 in | Wt 184.0 lb

## 2013-12-13 DIAGNOSIS — N760 Acute vaginitis: Secondary | ICD-10-CM | POA: Diagnosis not present

## 2013-12-13 DIAGNOSIS — Z124 Encounter for screening for malignant neoplasm of cervix: Secondary | ICD-10-CM

## 2013-12-13 DIAGNOSIS — Z01419 Encounter for gynecological examination (general) (routine) without abnormal findings: Secondary | ICD-10-CM | POA: Insufficient documentation

## 2013-12-13 DIAGNOSIS — Z1151 Encounter for screening for human papillomavirus (HPV): Secondary | ICD-10-CM | POA: Diagnosis present

## 2013-12-13 DIAGNOSIS — Z113 Encounter for screening for infections with a predominantly sexual mode of transmission: Secondary | ICD-10-CM | POA: Diagnosis present

## 2013-12-13 NOTE — Patient Instructions (Addendum)
Thank you for enrolling in [facility]. Please follow the instructions below to securely access your online medical record. MyChart allows you to send messages to your doctor, view your test results, manage appointments, and more.   How Do I Sign Up? 1. In your Internet browser, go to [employer] and enter [email or web address]. 2. Click on the Sign Up Now link in the Sign In box. You will see the New Member Sign Up page. 3. Enter your MyChart Access Code exactly as it appears below. You will not need to use this code after you've completed the sign-up process. If you do not sign up before the expiration date, you must request a new code.  MyChart Access Code: [email or web address] Expires: 02/11/2014  4:08 PM  4. Enter your Social Security Number ([id number]) and Date of Birth (mm/dd/yyyy) as indicated and click Submit. You will be taken to the next sign-up page. 5. Create a MyChart ID. This will be your MyChart login ID and cannot be changed, so think of one that is secure and easy to remember. 6. Create a MyChart password. You can change your password at any time. 7. Enter your Password Reset Question and Answer. This can be used at a later time if you forget your password.  8. Enter your e-mail address. You will receive e-mail notification when new information is available in [place]. 9. Click Sign Up. You can now view your medical record.   Additional Information Remember, MyChart is NOT to be used for urgent needs. For medical emergencies, dial 911.      Preventive Care for Adults A healthy lifestyle and preventive care can promote health and wellness. Preventive health guidelines for women include the following key practices.  A routine yearly physical is a good way to check with your health care provider about your health and preventive screening. It is a chance to share any concerns and updates on your health and to receive a thorough exam.  Visit your dentist for a  routine exam and preventive care every 6 months. Brush your teeth twice a day and floss once a day. Good oral hygiene prevents tooth decay and gum disease.  The frequency of eye exams is based on your age, health, family medical history, use of contact lenses, and other factors. Follow your health care provider's recommendations for frequency of eye exams.  Eat a healthy diet. Foods like vegetables, fruits, whole grains, low-fat dairy products, and lean protein foods contain the nutrients you need without too many calories. Decrease your intake of foods high in solid fats, added sugars, and salt. Eat the right amount of calories for you.Get information about a proper diet from your health care provider, if necessary.  Regular physical exercise is one of the most important things you can do for your health. Most adults should get at least 150 minutes of moderate-intensity exercise (any activity that increases your heart rate and causes you to sweat) each week. In addition, most adults need muscle-strengthening exercises on 2 or more days a week.  Maintain a healthy weight. The body mass index (BMI) is a screening tool to identify possible weight problems. It provides an estimate of body fat based on height and weight. Your health care provider can find your BMI and can help you achieve or maintain a healthy weight.For adults 20 years and older:  A BMI below 18.5 is considered underweight.  A BMI of 18.5 to 24.9 is normal.  A BMI of 25 to 29.9 is considered overweight.  A BMI of 30 and above is considered obese.  Maintain normal blood lipids and cholesterol levels by exercising and minimizing your intake of saturated fat. Eat a balanced diet with plenty of fruit and vegetables. Blood tests for lipids and cholesterol should begin at age 28 and be repeated every 5 years. If your lipid or cholesterol levels are high, you are over 50, or you are at high risk for heart disease, you may need your  cholesterol levels checked more frequently.Ongoing high lipid and cholesterol levels should be treated with medicines if diet and exercise are not working.  If you smoke, find out from your health care provider how to quit. If you do not use tobacco, do not start.  Lung cancer screening is recommended for adults aged 85-80 years who are at high risk for developing lung cancer because of a history of smoking. A yearly low-dose CT scan of the lungs is recommended for people who have at least a 30-pack-year history of smoking and are a current smoker or have quit within the past 15 years. A pack year of smoking is smoking an average of 1 pack of cigarettes a day for 1 year (for example: 1 pack a day for 30 years or 2 packs a day for 15 years). Yearly screening should continue until the smoker has stopped smoking for at least 15 years. Yearly screening should be stopped for people who develop a health problem that would prevent them from having lung cancer treatment.  If you are pregnant, do not drink alcohol. If you are breastfeeding, be very cautious about drinking alcohol. If you are not pregnant and choose to drink alcohol, do not have more than 1 drink per day. One drink is considered to be 12 ounces (355 mL) of beer, 5 ounces (148 mL) of wine, or 1.5 ounces (44 mL) of liquor.  Avoid use of street drugs. Do not share needles with anyone. Ask for help if you need support or instructions about stopping the use of drugs.  High blood pressure causes heart disease and increases the risk of stroke. Your blood pressure should be checked at least every 1 to 2 years. Ongoing high blood pressure should be treated with medicines if weight loss and exercise do not work.  If you are 55-47 years old, ask your health care provider if you should take aspirin to prevent strokes.  Diabetes screening involves taking a blood sample to check your fasting blood sugar level. This should be done once every 3 years, after  age 65, if you are within normal weight and without risk factors for diabetes. Testing should be considered at a younger age or be carried out more frequently if you are overweight and have at least 1 risk factor for diabetes.  Breast cancer screening is essential preventive care for women. You should practice "breast self-awareness." This means understanding the normal appearance and feel of your breasts and may include breast self-examination. Any changes detected, no matter how small, should be reported to a health care provider. Women in their 54s and 30s should have a clinical breast exam (CBE) by a health care provider as part of a regular health exam every 1 to 3 years. After age 55, women should have a CBE every year. Starting at age 73, women should consider having a mammogram (breast X-ray test) every year. Women who have a family history of breast cancer should talk to their health care provider about genetic screening. Women at a high risk  of breast cancer should talk to their health care providers about having an MRI and a mammogram every year.  Breast cancer gene (BRCA)-related cancer risk assessment is recommended for women who have family members with BRCA-related cancers. BRCA-related cancers include breast, ovarian, tubal, and peritoneal cancers. Having family members with these cancers may be associated with an increased risk for harmful changes (mutations) in the breast cancer genes BRCA1 and BRCA2. Results of the assessment will determine the need for genetic counseling and BRCA1 and BRCA2 testing.  Routine pelvic exams to screen for cancer are no longer recommended for nonpregnant women who are considered low risk for cancer of the pelvic organs (ovaries, uterus, and vagina) and who do not have symptoms. Ask your health care provider if a screening pelvic exam is right for you.  If you have had past treatment for cervical cancer or a condition that could lead to cancer, you need Pap  tests and screening for cancer for at least 20 years after your treatment. If Pap tests have been discontinued, your risk factors (such as having a new sexual partner) need to be reassessed to determine if screening should be resumed. Some women have medical problems that increase the chance of getting cervical cancer. In these cases, your health care provider may recommend more frequent screening and Pap tests.  The HPV test is an additional test that may be used for cervical cancer screening. The HPV test looks for the virus that can cause the cell changes on the cervix. The cells collected during the Pap test can be tested for HPV. The HPV test could be used to screen women aged 79 years and older, and should be used in women of any age who have unclear Pap test results. After the age of 60, women should have HPV testing at the same frequency as a Pap test.  Colorectal cancer can be detected and often prevented. Most routine colorectal cancer screening begins at the age of 71 years and continues through age 28 years. However, your health care provider may recommend screening at an earlier age if you have risk factors for colon cancer. On a yearly basis, your health care provider may provide home test kits to check for hidden blood in the stool. Use of a small camera at the end of a tube, to directly examine the colon (sigmoidoscopy or colonoscopy), can detect the earliest forms of colorectal cancer. Talk to your health care provider about this at age 36, when routine screening begins. Direct exam of the colon should be repeated every 5-10 years through age 95 years, unless early forms of pre-cancerous polyps or small growths are found.  People who are at an increased risk for hepatitis B should be screened for this virus. You are considered at high risk for hepatitis B if:  You were born in a country where hepatitis B occurs often. Talk with your health care provider about which countries are considered  high risk.  Your parents were born in a high-risk country and you have not received a shot to protect against hepatitis B (hepatitis B vaccine).  You have HIV or AIDS.  You use needles to inject street drugs.  You live with, or have sex with, someone who has hepatitis B.  You get hemodialysis treatment.  You take certain medicines for conditions like cancer, organ transplantation, and autoimmune conditions.  Hepatitis C blood testing is recommended for all people born from [birth year] through 1965 and any individual with known risks for  hepatitis C.  Practice safe sex. Use condoms and avoid high-risk sexual practices to reduce the spread of sexually transmitted infections (STIs). STIs include gonorrhea, chlamydia, syphilis, trichomonas, herpes, HPV, and human immunodeficiency virus (HIV). Herpes, HIV, and HPV are viral illnesses that have no cure. They can result in disability, cancer, and death.  You should be screened for sexually transmitted illnesses (STIs) including gonorrhea and chlamydia if:  You are sexually active and are younger than 24 years.  You are older than 24 years and your health care provider tells you that you are at risk for this type of infection.  Your sexual activity has changed since you were last screened and you are at an increased risk for chlamydia or gonorrhea. Ask your health care provider if you are at risk.  If you are at risk of being infected with HIV, it is recommended that you take a prescription medicine daily to prevent HIV infection. This is called preexposure prophylaxis (PrEP). You are considered at risk if:  You are a heterosexual woman, are sexually active, and are at increased risk for HIV infection.  You take drugs by injection.  You are sexually active with a partner who has HIV.  Talk with your health care provider about whether you are at high risk of being infected with HIV. If you choose to begin PrEP, you should first be tested for HIV.  You should then be tested every 3 months for as long as you are taking PrEP.  Osteoporosis is a disease in which the bones lose minerals and strength with aging. This can result in serious bone fractures or breaks. The risk of osteoporosis can be identified using a bone density scan. Women ages 37 years and over and women at risk for fractures or osteoporosis should discuss screening with their health care providers. Ask your health care provider whether you should take a calcium supplement or vitamin D to reduce the rate of osteoporosis.  Menopause can be associated with physical symptoms and risks. Hormone replacement therapy is available to decrease symptoms and risks. You should talk to your health care provider about whether hormone replacement therapy is right for you.  Use sunscreen. Apply sunscreen liberally and repeatedly throughout the day. You should seek shade when your shadow is shorter than you. Protect yourself by wearing long sleeves, pants, a wide-brimmed hat, and sunglasses year round, whenever you are outdoors.  Once a month, do a whole body skin exam, using a mirror to look at the skin on your back. Tell your health care provider of new moles, moles that have irregular borders, moles that are larger than a pencil eraser, or moles that have changed in shape or color.  Stay current with required vaccines (immunizations).  Influenza vaccine. All adults should be immunized every year.  Tetanus, diphtheria, and acellular pertussis (Td, Tdap) vaccine. Pregnant women should receive 1 dose of Tdap vaccine during each pregnancy. The dose should be obtained regardless of the length of time since the last dose. Immunization is preferred during the 27th-36th week of gestation. An adult who has not previously received Tdap or who does not know her vaccine status should receive 1 dose of Tdap. This initial dose should be followed by tetanus and diphtheria toxoids (Td) booster doses every 10  years. Adults with an unknown or incomplete history of completing a 3-dose immunization series with Td-containing vaccines should begin or complete a primary immunization series including a Tdap dose. Adults should receive a Td booster every  10 years.  Varicella vaccine. An adult without evidence of immunity to varicella should receive 2 doses or a second dose if she has previously received 1 dose. Pregnant females who do not have evidence of immunity should receive the first dose after pregnancy. This first dose should be obtained before leaving the health care facility. The second dose should be obtained 4-8 weeks after the first dose.  Human papillomavirus (HPV) vaccine. Females aged 13-26 years who have not received the vaccine previously should obtain the 3-dose series. The vaccine is not recommended for use in pregnant females. However, pregnancy testing is not needed before receiving a dose. If a female is found to be pregnant after receiving a dose, no treatment is needed. In that case, the remaining doses should be delayed until after the pregnancy. Immunization is recommended for any person with an immunocompromised condition through the age of 63 years if she did not get any or all doses earlier. During the 3-dose series, the second dose should be obtained 4-8 weeks after the first dose. The third dose should be obtained 24 weeks after the first dose and 16 weeks after the second dose.  Zoster vaccine. One dose is recommended for adults aged 105 years or older unless certain conditions are present.  Measles, mumps, and rubella (MMR) vaccine. Adults born before [birth year] generally are considered immune to measles and mumps. Adults born in [birth year] or later should have 1 or more doses of MMR vaccine unless there is a contraindication to the vaccine or there is laboratory evidence of immunity to each of the three diseases. A routine second dose of MMR vaccine should be obtained at least 28 days after the first  dose for students attending postsecondary schools, health care workers, or international travelers. People who received inactivated measles vaccine or an unknown type of measles vaccine during 1963-1967 should receive 2 doses of MMR vaccine. People who received inactivated mumps vaccine or an unknown type of mumps vaccine before 1979 and are at high risk for mumps infection should consider immunization with 2 doses of MMR vaccine. For females of childbearing age, rubella immunity should be determined. If there is no evidence of immunity, females who are not pregnant should be vaccinated. If there is no evidence of immunity, females who are pregnant should delay immunization until after pregnancy. Unvaccinated health care workers born before [birth year] who lack laboratory evidence of measles, mumps, or rubella immunity or laboratory confirmation of disease should consider measles and mumps immunization with 2 doses of MMR vaccine or rubella immunization with 1 dose of MMR vaccine.  Pneumococcal 13-valent conjugate (PCV13) vaccine. When indicated, a person who is uncertain of her immunization history and has no record of immunization should receive the PCV13 vaccine. An adult aged 37 years or older who has certain medical conditions and has not been previously immunized should receive 1 dose of PCV13 vaccine. This PCV13 should be followed with a dose of pneumococcal polysaccharide (PPSV23) vaccine. The PPSV23 vaccine dose should be obtained at least 8 weeks after the dose of PCV13 vaccine. An adult aged 46 years or older who has certain medical conditions and previously received 1 or more doses of PPSV23 vaccine should receive 1 dose of PCV13. The PCV13 vaccine dose should be obtained 1 or more years after the last PPSV23 vaccine dose.  Pneumococcal polysaccharide (PPSV23) vaccine. When PCV13 is also indicated, PCV13 should be obtained first. All adults aged 60 years and older should be immunized. An adult younger than  age 37 years who has certain medical conditions should be immunized. Any person who resides in a nursing home or long-term care facility should be immunized. An adult smoker should be immunized. People with an immunocompromised condition and certain other conditions should receive both PCV13 and PPSV23 vaccines. People with human immunodeficiency virus (HIV) infection should be immunized as soon as possible after diagnosis. Immunization during chemotherapy or radiation therapy should be avoided. Routine use of PPSV23 vaccine is not recommended for American Indians, [place] Natives, or people younger than 65 years unless there are medical conditions that require PPSV23 vaccine. When indicated, people who have unknown immunization and have no record of immunization should receive PPSV23 vaccine. One-time revaccination 5 years after the first dose of PPSV23 is recommended for people aged 19-64 years who have chronic kidney failure, nephrotic syndrome, asplenia, or immunocompromised conditions. People who received 1-2 doses of PPSV23 before age 95 years should receive another dose of PPSV23 vaccine at age 78 years or later if at least 5 years have passed since the previous dose. Doses of PPSV23 are not needed for people immunized with PPSV23 at or after age 34 years.  Meningococcal vaccine. Adults with asplenia or persistent complement component deficiencies should receive 2 doses of quadrivalent meningococcal conjugate (MenACWY-D) vaccine. The doses should be obtained at least 2 months apart. Microbiologists working with certain meningococcal bacteria, [employer] recruits, people at risk during an outbreak, and people who travel to or live in countries with a high rate of meningitis should be immunized. A first-year college student up through age 82 years who is living in a residence hall should receive a dose if she did not receive a dose on or after her 16th birthday. Adults who have certain high-risk conditions  should receive one or more doses of vaccine.  Hepatitis A vaccine. Adults who wish to be protected from this disease, have certain high-risk conditions, work with hepatitis A-infected animals, work in hepatitis A research labs, or travel to or work in countries with a high rate of hepatitis A should be immunized. Adults who were previously unvaccinated and who anticipate close contact with an international adoptee during the first 60 days after arrival in the [place] from a country with a high rate of hepatitis A should be immunized.  Hepatitis B vaccine. Adults who wish to be protected from this disease, have certain high-risk conditions, may be exposed to blood or other infectious body fluids, are household contacts or sex partners of hepatitis B positive people, are clients or workers in certain care facilities, or travel to or work in countries with a high rate of hepatitis B should be immunized.  Haemophilus influenzae type b (Hib) vaccine. A previously unvaccinated person with asplenia or sickle cell disease or having a scheduled splenectomy should receive 1 dose of Hib vaccine. Regardless of previous immunization, a recipient of a hematopoietic stem cell transplant should receive a 3-dose series 6-12 months after her successful transplant. Hib vaccine is not recommended for adults with HIV infection. Preventive Services / Frequency Ages 23 to 23 years  Blood pressure check.** / Every 1 to 2 years.  Lipid and cholesterol check.** / Every 5 years beginning at age 46.  Clinical breast exam.** / Every 3 years for women in their 12s and 29s.  BRCA-related cancer risk assessment.** / For women who have family members with a BRCA-related cancer (breast, ovarian, tubal, or peritoneal cancers).  Pap test.** / Every 2 years from ages 65 through 62. Every 3  years starting at age 74 through age 10 or 57 with a history of 3 consecutive normal Pap tests.  HPV screening.** / Every 3 years from ages  105 through ages 64 to 47 with a history of 3 consecutive normal Pap tests.  Hepatitis C blood test.** / For any individual with known risks for hepatitis C.  Skin self-exam. / Monthly.  Influenza vaccine. / Every year.  Tetanus, diphtheria, and acellular pertussis (Tdap, Td) vaccine.** / Consult your health care provider. Pregnant women should receive 1 dose of Tdap vaccine during each pregnancy. 1 dose of Td every 10 years.  Varicella vaccine.** / Consult your health care provider. Pregnant females who do not have evidence of immunity should receive the first dose after pregnancy.  HPV vaccine. / 3 doses over 6 months, if 32 and younger. The vaccine is not recommended for use in pregnant females. However, pregnancy testing is not needed before receiving a dose.  Measles, mumps, rubella (MMR) vaccine.** / You need at least 1 dose of MMR if you were born in 1957 or later. You may also need a 2nd dose. For females of childbearing age, rubella immunity should be determined. If there is no evidence of immunity, females who are not pregnant should be vaccinated. If there is no evidence of immunity, females who are pregnant should delay immunization until after pregnancy.  Pneumococcal 13-valent conjugate (PCV13) vaccine.** / Consult your health care provider.  Pneumococcal polysaccharide (PPSV23) vaccine.** / 1 to 2 doses if you smoke cigarettes or if you have certain conditions.  Meningococcal vaccine.** / 1 dose if you are age 44 to 39 years and a Market researcher living in a residence hall, or have one of several medical conditions, you need to get vaccinated against meningococcal disease. You may also need additional booster doses.  Hepatitis A vaccine.** / Consult your health care provider.  Hepatitis B vaccine.** / Consult your health care provider.  Haemophilus influenzae type b (Hib) vaccine.** / Consult your health care provider. Ages 6 to 44 years  Blood pressure check.**  / Every 1 to 2 years.  Lipid and cholesterol check.** / Every 5 years beginning at age 18 years.  Lung cancer screening. / Every year if you are aged 8-80 years and have a 30-pack-year history of smoking and currently smoke or have quit within the past 15 years. Yearly screening is stopped once you have quit smoking for at least 15 years or develop a health problem that would prevent you from having lung cancer treatment.  Clinical breast exam.** / Every year after age 64 years.  BRCA-related cancer risk assessment.** / For women who have family members with a BRCA-related cancer (breast, ovarian, tubal, or peritoneal cancers).  Mammogram.** / Every year beginning at age 34 years and continuing for as long as you are in good health. Consult with your health care provider.  Pap test.** / Every 3 years starting at age 59 years through age 47 or 43 years with a history of 3 consecutive normal Pap tests.  HPV screening.** / Every 3 years from ages 102 years through ages 74 to 82 years with a history of 3 consecutive normal Pap tests.  Fecal occult blood test (FOBT) of stool. / Every year beginning at age 21 years and continuing until age 19 years. You may not need to do this test if you get a colonoscopy every 10 years.  Flexible sigmoidoscopy or colonoscopy.** / Every 5 years for a flexible sigmoidoscopy or every  10 years for a colonoscopy beginning at age 6 years and continuing until age 55 years.  Hepatitis C blood test.** / For all people born from [birth year] through 1965 and any individual with known risks for hepatitis C.  Skin self-exam. / Monthly.  Influenza vaccine. / Every year.  Tetanus, diphtheria, and acellular pertussis (Tdap/Td) vaccine.** / Consult your health care provider. Pregnant women should receive 1 dose of Tdap vaccine during each pregnancy. 1 dose of Td every 10 years.  Varicella vaccine.** / Consult your health care provider. Pregnant females who do not have evidence of  immunity should receive the first dose after pregnancy.  Zoster vaccine.** / 1 dose for adults aged 86 years or older.  Measles, mumps, rubella (MMR) vaccine.** / You need at least 1 dose of MMR if you were born in 1957 or later. You may also need a 2nd dose. For females of childbearing age, rubella immunity should be determined. If there is no evidence of immunity, females who are not pregnant should be vaccinated. If there is no evidence of immunity, females who are pregnant should delay immunization until after pregnancy.  Pneumococcal 13-valent conjugate (PCV13) vaccine.** / Consult your health care provider.  Pneumococcal polysaccharide (PPSV23) vaccine.** / 1 to 2 doses if you smoke cigarettes or if you have certain conditions.  Meningococcal vaccine.** / Consult your health care provider.  Hepatitis A vaccine.** / Consult your health care provider.  Hepatitis B vaccine.** / Consult your health care provider.  Haemophilus influenzae type b (Hib) vaccine.** / Consult your health care provider. Ages 39 years and over  Blood pressure check.** / Every 1 to 2 years.  Lipid and cholesterol check.** / Every 5 years beginning at age 60 years.  Lung cancer screening. / Every year if you are aged 25-80 years and have a 30-pack-year history of smoking and currently smoke or have quit within the past 15 years. Yearly screening is stopped once you have quit smoking for at least 15 years or develop a health problem that would prevent you from having lung cancer treatment.  Clinical breast exam.** / Every year after age 41 years.  BRCA-related cancer risk assessment.** / For women who have family members with a BRCA-related cancer (breast, ovarian, tubal, or peritoneal cancers).  Mammogram.** / Every year beginning at age 78 years and continuing for as long as you are in good health. Consult with your health care provider.  Pap test.** / Every 3 years starting at age 37 years through age 99 or  25 years with 3 consecutive normal Pap tests. Testing can be stopped between 65 and 70 years with 3 consecutive normal Pap tests and no abnormal Pap or HPV tests in the past 10 years.  HPV screening.** / Every 3 years from ages 36 years through ages 65 or 82 years with a history of 3 consecutive normal Pap tests. Testing can be stopped between 65 and 70 years with 3 consecutive normal Pap tests and no abnormal Pap or HPV tests in the past 10 years.  Fecal occult blood test (FOBT) of stool. / Every year beginning at age 26 years and continuing until age 65 years. You may not need to do this test if you get a colonoscopy every 10 years.  Flexible sigmoidoscopy or colonoscopy.** / Every 5 years for a flexible sigmoidoscopy or every 10 years for a colonoscopy beginning at age 81 years and continuing until age 59 years.  Hepatitis C blood test.** / For all people born  from Normal through 1965 and any individual with known risks for hepatitis C.  Osteoporosis screening.** / A one-time screening for women ages 38 years and over and women at risk for fractures or osteoporosis.  Skin self-exam. / Monthly.  Influenza vaccine. / Every year.  Tetanus, diphtheria, and acellular pertussis (Tdap/Td) vaccine.** / 1 dose of Td every 10 years.  Varicella vaccine.** / Consult your health care provider.  Zoster vaccine.** / 1 dose for adults aged 77 years or older.  Pneumococcal 13-valent conjugate (PCV13) vaccine.** / Consult your health care provider.  Pneumococcal polysaccharide (PPSV23) vaccine.** / 1 dose for all adults aged 61 years and older.  Meningococcal vaccine.** / Consult your health care provider.  Hepatitis A vaccine.** / Consult your health care provider.  Hepatitis B vaccine.** / Consult your health care provider.  Haemophilus influenzae type b (Hib) vaccine.** / Consult your health care provider. ** Family history and personal history of risk and conditions may change your health care  provider's recommendations. Document Released: 05/28/2001 Document Revised: 08/16/2013 Document Reviewed: 08/27/2010 [facility] Patient Information 2015 [place], [place]. This information is not intended to replace advice given to you by your health care provider. Make sure you discuss any questions you have with your health care provider.

## 2013-12-13 NOTE — Progress Notes (Signed)
    GYNECOLOGY CLINIC ANNUAL PREVENTATIVE CARE ENCOUNTER NOTE  Subjective:     [name] is a 48 y.o. G1P1 female here for a routine annual gynecologic exam.  Desires STI testing. No bleeding since HTA endometrial ablation on 07/09/11. No other GYN symptoms.    Gynecologic History No LMP recorded. Patient has had an ablation. Contraception: Boyfriend had a vasectomy Last Pap: 11/26/13. Results were: normal with negative HRHPV Last mammogram: 06/04/13. Results were: normal  Obstetric History OB History  Gravida Para Term Preterm AB SAB TAB Ectopic Multiple Living  1 1            # Outcome Date GA Lbr Len/2nd Weight Sex Delivery Anes PTL Lv  1 PAR               The following portions of the patient's history were reviewed and updated as appropriate: allergies, current medications, past family history, past medical history, past social history, past surgical history and problem list.  Review of Systems Pertinent items are noted in HPI.    Objective:   BP 116/65  Pulse 89  Ht 5' 6.5" (1.689 m)  Wt 184 lb (83.462 kg)  BMI 29.26 kg/m2 GENERAL: Well-developed, well-nourished female in no acute distress.  HEENT: Normocephalic, atraumatic. Sclerae anicteric.  NECK: Supple. Normal thyroid.  LUNGS: Clear to auscultation bilaterally.  HEART: Regular rate and rhythm. BREASTS: Symmetric in size. No masses, skin changes, nipple drainage, or lymphadenopathy. ABDOMEN: Soft, nontender, nondistended. No organomegaly. PELVIC: Normal external female genitalia. Vagina is pink and rugated.  Normal discharge. Normal cervix contour. Pap smear obtained. Uterus is normal in size. No adnexal mass or tenderness.  EXTREMITIES: No cyanosis, clubbing, or edema, 2+ distal pulses.   Assessment:   Annual gynecologic examination   Plan:   Pap done, will follow up results and manage accordingly. Ancillary testing done for STI testing and serum STI testing done, will follow up results and manage  accordingly. Routine preventative health maintenance measures emphasized   Verita Schneiders, MD, [place] Attending [place] for [place]

## 2013-12-14 LAB — HIV ANTIBODY (ROUTINE TESTING W REFLEX): HIV 1&2 Ab, 4th Generation: NONREACTIVE

## 2013-12-14 LAB — RPR

## 2013-12-14 LAB — HEPATITIS C ANTIBODY: HCV Ab: NEGATIVE

## 2013-12-16 LAB — CYTOLOGY - PAP

## 2014-02-14 ENCOUNTER — Encounter: Payer: Self-pay | Admitting: Obstetrics & Gynecology

## 2014-05-09 ENCOUNTER — Other Ambulatory Visit: Payer: Self-pay | Admitting: Obstetrics & Gynecology

## 2014-05-09 DIAGNOSIS — Z1231 Encounter for screening mammogram for malignant neoplasm of breast: Secondary | ICD-10-CM

## 2014-06-06 ENCOUNTER — Ambulatory Visit (HOSPITAL_COMMUNITY)
Admission: RE | Admit: 2014-06-06 | Discharge: 2014-06-06 | Disposition: A | Discharge status: Home / Self Care | Payer: BLUE CROSS/BLUE SHIELD | Source: Ambulatory Visit | Attending: Obstetrics & Gynecology | Admitting: Obstetrics & Gynecology

## 2014-06-06 DIAGNOSIS — Z1231 Encounter for screening mammogram for malignant neoplasm of breast: Secondary | ICD-10-CM | POA: Diagnosis present

## 2014-07-28 ENCOUNTER — Other Ambulatory Visit: Payer: Self-pay | Admitting: Obstetrics & Gynecology

## 2014-08-04 ENCOUNTER — Emergency Department
Admit: 2014-08-04 | Disposition: A | Discharge status: Home / Self Care | Payer: Self-pay | Admitting: Emergency Medicine

## 2014-10-14 ENCOUNTER — Telehealth: Payer: Self-pay | Admitting: *Deleted

## 2014-10-14 DIAGNOSIS — B009 Herpesviral infection, unspecified: Secondary | ICD-10-CM

## 2014-10-14 MED ORDER — VALACYCLOVIR HCL 500 MG PO TABS: 500.0000 mg | ORAL_TABLET | Freq: Every day | ORAL | Status: DC | PRN

## 2014-10-14 NOTE — Telephone Encounter (Signed)
Patient called and needed more Valtrex sent in.  I have sent in refills to patients pharmacy.  Patient aware.

## 2015-06-16 ENCOUNTER — Ambulatory Visit
Admission: RE | Admit: 2015-06-16 | Discharge: 2015-06-16 | Disposition: A | Discharge status: Home / Self Care | Payer: BC Managed Care – PPO | Source: Ambulatory Visit | Attending: Family Medicine | Admitting: Family Medicine

## 2015-06-16 ENCOUNTER — Encounter: Payer: Self-pay | Admitting: Emergency Medicine

## 2015-06-16 ENCOUNTER — Emergency Department
Admission: EM | Admit: 2015-06-16 | Discharge: 2015-06-16 | Disposition: A | Discharge status: Home / Self Care | Payer: BC Managed Care – PPO | Attending: Emergency Medicine | Admitting: Emergency Medicine

## 2015-06-16 ENCOUNTER — Other Ambulatory Visit: Payer: Self-pay | Admitting: Family Medicine

## 2015-06-16 DIAGNOSIS — R9389 Abnormal findings on diagnostic imaging of other specified body structures: Secondary | ICD-10-CM

## 2015-06-16 DIAGNOSIS — R109 Unspecified abdominal pain: Secondary | ICD-10-CM | POA: Insufficient documentation

## 2015-06-16 DIAGNOSIS — D259 Leiomyoma of uterus, unspecified: Secondary | ICD-10-CM | POA: Insufficient documentation

## 2015-06-16 DIAGNOSIS — Z91040 Latex allergy status: Secondary | ICD-10-CM | POA: Diagnosis not present

## 2015-06-16 DIAGNOSIS — N83202 Unspecified ovarian cyst, left side: Secondary | ICD-10-CM

## 2015-06-16 DIAGNOSIS — Z79899 Other long term (current) drug therapy: Secondary | ICD-10-CM | POA: Diagnosis not present

## 2015-06-16 DIAGNOSIS — R938 Abnormal findings on diagnostic imaging of other specified body structures: Secondary | ICD-10-CM

## 2015-06-16 DIAGNOSIS — R1032 Left lower quadrant pain: Secondary | ICD-10-CM | POA: Diagnosis present

## 2015-06-16 LAB — WET PREP, GENITAL
Sperm: NONE SEEN
Trich, Wet Prep: NONE SEEN
Yeast Wet Prep HPF POC: NONE SEEN

## 2015-06-16 LAB — CHLAMYDIA/NGC RT PCR (ARMC ONLY)
CHLAMYDIA TR: NOT DETECTED
N gonorrhoeae: NOT DETECTED

## 2015-06-16 MED ORDER — IOHEXOL 350 MG/ML SOLN
100.0000 mL | Freq: Once | INTRAVENOUS | Status: AC | PRN
  Administered 2015-06-16: 100 mL via INTRAVENOUS

## 2015-06-16 MED ORDER — OXYCODONE-ACETAMINOPHEN 5-325 MG PO TABS: 1.0000 | ORAL_TABLET | Freq: Four times a day (QID) | ORAL | Status: DC | PRN

## 2015-06-16 MED ORDER — OXYCODONE-ACETAMINOPHEN 5-325 MG PO TABS
1.0000 | ORAL_TABLET | Freq: Once | ORAL | Status: AC
  Administered 2015-06-16: 1 via ORAL
  Filled 2015-06-16: qty 1

## 2015-06-16 MED ORDER — KETOROLAC TROMETHAMINE 30 MG/ML IJ SOLN
30.0000 mg | Freq: Once | INTRAMUSCULAR | Status: AC
  Administered 2015-06-16: 30 mg via INTRAVENOUS
  Filled 2015-06-16: qty 1

## 2015-06-16 MED ORDER — MORPHINE SULFATE (PF) 4 MG/ML IV SOLN
4.0000 mg | Freq: Once | INTRAVENOUS | Status: AC
  Administered 2015-06-16: 4 mg via INTRAVENOUS
  Filled 2015-06-16: qty 1

## 2015-06-16 MED ORDER — SODIUM CHLORIDE 0.9 % IV BOLUS (SEPSIS)
1000.0000 mL | Freq: Once | INTRAVENOUS | Status: AC
  Administered 2015-06-16: 1000 mL via INTRAVENOUS

## 2015-06-16 NOTE — ED Provider Notes (Addendum)
[facility] Emergency Department Provider Note  ____________________________________________   I have reviewed the triage vital signs and the nursing notes.   HISTORY  Chief Complaint Abdominal Pain    HPI [name] is a 50 y.o. female who has a history of menorrhagia, did have an ablation a few years ago at [facility] for that, history of anemia as a result of that anteriorly, patient does not have a history of STI. She states that she's been with the same partner for 19 years. No vaginal discharge. No history of ovarian cysts. She states that she started having mostly left lower quadrant pelvic pain abruptly last evening. The pain is sharp. Does not radiate. Nothing makes it better, is worse when she walks around. She denies any fever or chills. She denies any nausea or vomiting. She states that she's had no vaginal bleeding diarrhea. She states she has had some mild anorexia. She went to a outpatient clinic today, an extensive workup was performed at that time. Including CT scan as well as ultrasound. The CT scan and ultrasound results are in the computer here. Blood work was also performed, she at that time had a negative pregnancy test. Her white count was not markedly elevated at 11.2, hemoglobin was 12.4 hematocrit 38 platelet count was normal. CMP showed a glucose of 130 otherwise normal BMP. Urinalysis showed some bacteria but 0-3 whites no leuks and no nitrates. Ultrasound from outpatient facility shows the following. 1 no convincing acute findings to uterus enlarged formerly by dominant fundal fibroid measuring 11 7 m in greatest dimension 3 complex fluid collection that distends the endometrial canal measuring 3.5 x 2.1 x 2.3 cm. Could be due to progress of hemorrhage but is nonspecific. 4 dominant single-appearing left ovarian cyst. Left ovary otherwise unremarkable right ovary not visualized no adnexal masses.   Patient does not have periods anymore  occasionally she will have spotting and she has had no recent bleeding.  Past Medical History  Diagnosis Date  . Herpes   . Vaginal discharge   . Anemia     on iron pills    Patient Active Problem List   Diagnosis Date Noted  . Menorrhagia 06/05/2011  . Anemia 05/14/2011  . Bacterial vaginosis 05/14/2011  . 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 Allergic to latex 12/05/2005  . Allergic to food 12/05/2005  . Dermatitis, eczematoid 02/19/2005    Past Surgical History  Procedure Laterality Date  . Svd  1999    x 1  . Wisdom tooth extraction    . Right wrist surgery      Current Outpatient Rx  Name  Route  Sig  Dispense  Refill  . ferrous sulfate 324 (65 FE) MG TBEC               . ferrous sulfate 325 (65 FE) MG tablet      TAKE 1 TABLET (325 MG TOTAL) BY MOUTH 2 (TWO) TIMES DAILY WITH A MEAL.   60 tablet   8   . fexofenadine (ALLEGRA) 180 MG tablet   Oral   Take 180 mg by mouth.         . triamcinolone cream (KENALOG) 0.1 %   Topical   Apply topically.         . valACYclovir (VALTREX) 500 MG tablet   Oral   Take 1 tablet (500 mg total) by mouth daily as needed. For outbreaks   30 tablet   3  Allergies Latex  Family History  Problem Relation Age of Onset  . Cancer Paternal Grandmother     BREAST    Social History Social History  Substance Use Topics  . Smoking status: Never Smoker   . Smokeless tobacco: Never Used  . Alcohol Use: No    Review of Systems Constitutional: No fever/chills Eyes: No visual changes. ENT: No sore throat. No stiff neck no neck pain Cardiovascular: Denies chest pain. Respiratory: Denies shortness of breath. Gastrointestinal:   no vomiting.  No diarrhea.  No constipation. Genitourinary: Negative for dysuria. Musculoskeletal: Negative lower extremity swelling Skin: Negative for rash. Neurological: Negative for headaches, focal weakness or numbness. 10-point ROS otherwise  negative.  ____________________________________________   PHYSICAL EXAM:  VITAL SIGNS: ED Triage Vitals  Enc Vitals Group     BP 06/16/15 1631 128/71 mmHg     Pulse Rate 06/16/15 1631 103     Resp 06/16/15 1631 18     Temp 06/16/15 1631 97.5 F (36.4 C)     Temp Source 06/16/15 1631 Oral     SpO2 06/16/15 1631 100 %     Weight 06/16/15 1631 173 lb (78.472 kg)     Height 06/16/15 1631 5\' 6"  (1.676 m)     Head Cir --      Peak Flow --      Pain Score 06/16/15 1633 8     Pain Loc --      Pain Edu? --      Excl. in [place]? --     Constitutional: Alert and oriented. Well appearing and in no acute distress. Eyes: Conjunctivae are normal. PERRL. EOMI. Head: Atraumatic. Nose: No congestion/rhinnorhea. Mouth/Throat: Mucous membranes are moist.  Oropharynx non-erythematous. Neck: No stridor.   Nontender with no meningismus Cardiovascular: Normal rate, regular rhythm. Grossly normal heart sounds.  Good peripheral circulation. Respiratory: Normal respiratory effort.  No retractions. Lungs CTAB. Abdominal:Is tender in the suprapubic and left lower quadrant region with no rebound positive voluntary guarding.  Back:  There is no focal tenderness or step off there is no midline tenderness there are no lesions noted. there is no CVA tenderness Pelvic exam: Female nurse chaperone present, no external lesions noted, physiologic vaginal discharge noted with no purulent discharge, no cervical motion tenderness, there is positive adnexal tenderness on the left which is quite tender, there is some minimal uterine tenderness, no masses noted on the adnexa, No vaginal bleeding Musculoskeletal: No lower extremity tenderness. No joint effusions, no DVT signs strong distal pulses no edema Neurologic:  Normal speech and language. No gross focal neurologic deficits are appreciated.  Skin:  Skin is warm, dry and intact. No rash noted. Psychiatric: Mood and affect are normal. Speech and behavior are  normal.  ____________________________________________   LABS (all labs ordered are listed, but only abnormal results are displayed)  Labs Reviewed  CHLAMYDIA/NGC RT PCR ([facility] ONLY)  WET PREP, GENITAL   ____________________________________________  EKG  I personally interpreted any EKGs ordered by me or triage  ____________________________________________  RADIOLOGY  I reviewed any imaging ordered by me or triage that were performed during my shift ____________________________________________   PROCEDURES  Procedure(s) performed: None  Critical Care performed: None  ____________________________________________   INITIAL IMPRESSION / ASSESSMENT AND PLAN / ED COURSE  Pertinent labs & imaging results that were available during my care of the patient were reviewed by me and considered in my medical decision making (see chart for details).  Patient presents here after essentially complete workup as  an outpatient for further assessment apparently by the emergency department after having had a CT scan and an ultrasound as well as blood work. Apparently they did not do a pelvic exam which I did perform here. Patient does have adnexal pain. She also has an ovarian cyst on that side. This is most likely secondary to the ovarian cyst. Given her pain medication and she is feeling somewhat better. She went to evidence on CT scan of diverticulitis or other intestinal pathology such as obstruction. Clinically I have a low suspicion that the patient has STI causing the symptoms. She does not actually have cervical motion tenderness. There is no purulent discharge. Low risk strata for sexual behavior to the extent that he can be determined. There is some question about the patient's fluid collection in the endometrial canal. I will discuss this with OB after we get results from what prep.   ----------------------------------------- 6:47 PM on  06/16/2015 -----------------------------------------  Discussed with Dr. Glo Herring who is able to look at the CT scan and the ultrasound and her blood work. He advised discharge with pain control and they will follow-up as an outpatient. I appreciate the consult. ____________________________________________   FINAL CLINICAL IMPRESSION(S) / ED DIAGNOSES  Final diagnoses:  None      This chart was dictated using voice recognition software.  Despite best efforts to proofread,  errors can occur which can change meaning.     Schuyler Amor, MD 06/16/15 1754  Schuyler Amor, MD 06/16/15 [phone]

## 2015-06-16 NOTE — Discharge Instructions (Signed)
Return to the emergency room for increased pain, fever, vomiting, or if you feel worse in any way. Do not feel to follow-up with your OB/GYN first thing on Monday.

## 2015-06-16 NOTE — ED Notes (Signed)
Patient seen by PCP today for c/o worsening abdominal pain x 1 day.  Had U/S and abdominal CT scan done today, sent to ED for further evaluation.

## 2015-06-19 ENCOUNTER — Encounter: Payer: Self-pay | Admitting: *Deleted

## 2015-06-19 ENCOUNTER — Ambulatory Visit (INDEPENDENT_AMBULATORY_CARE_PROVIDER_SITE_OTHER): Payer: BC Managed Care – PPO | Admitting: Obstetrics and Gynecology

## 2015-06-19 VITALS — BP 119/79 | HR 101 | Ht 66.0 in | Wt 173.0 lb

## 2015-06-19 DIAGNOSIS — R102 Pelvic and perineal pain unspecified side: Secondary | ICD-10-CM

## 2015-06-19 DIAGNOSIS — D259 Leiomyoma of uterus, unspecified: Secondary | ICD-10-CM | POA: Diagnosis not present

## 2015-06-19 MED ORDER — DOCUSATE SODIUM 100 MG PO CAPS: 100.0000 mg | ORAL_CAPSULE | Freq: Two times a day (BID) | ORAL | Status: DC | PRN

## 2015-06-19 NOTE — Progress Notes (Signed)
Lower pelvic pain, seen for full work up at [facility] and [facility]

## 2015-06-19 NOTE — Progress Notes (Signed)
Patient ID: [name], female   DOB: 1965/07/16, 50 y.o.   MRN: [id number] 50 yo presenting today for the evaluation of abdominal pain. Patient reports the onset of a lower abdominal pain which originates in the LLQ and radiates to the suprapubic region. The pain started 4 days ago. She was seen at [facility] clinic and at [facility] ED. Patient reports the onset of pain 4 days ago and has received some relief from the ibuprofen but not sufficiently. She is unable to work due to the pain. She denies dysuria, frequency or urgency. Patient denies constipation  Past Medical History  Diagnosis Date  . Herpes   . Vaginal discharge   . Anemia     on iron pills   Past Surgical History  Procedure Laterality Date  . Svd  1999    x 1  . Wisdom tooth extraction    . Right wrist surgery     Family History  Problem Relation Age of Onset  . Cancer Paternal Grandmother     BREAST   Social History  Substance Use Topics  . Smoking status: Never Smoker   . Smokeless tobacco: Never Used  . Alcohol Use: No   ROS See pertinent in HPI  Blood pressure 119/79, pulse 101, height 5\' 6"  (1.676 m), weight 173 lb (78.472 kg).  GENERAL: Well-developed, well-nourished female in no acute distress.  ABDOMEN: Soft, non distended, LLQ tenderness, palpable uterine fibroid .  PELVIC: Normal external female genitalia. Uterus is 18-weeks in size. Left adnexal and suprapubic tenderness. EXTREMITIES: No cyanosis, clubbing, or edema, 2+ distal pulses.  06/16/2015 Ultrasound FINDINGS: Uterus  Measurements: 11.1 x 5.5 x 8.6 cm. Fall large exophytic fibroid from the uterine fundus measuring 11.5 x 7.8 x 9.5 cm heterogeneous echogenicity area noted along the right margin of the mid uterine segment chills likely an additional fibroid. It measures 3.5 x 2.5 x 2.9 cm. The cervix is unremarkable.  Endometrium  Thickness: 14 mm. A complex fluid collection distends the upper uterine segment endometrial canal  measuring 3.5 x 2.1 x 2.3 cm. No endometrial mass.  Right ovary  Not visualized. No adnexal mass.  Left ovary  Measurements: 2.8 x 3.5 x 2.2 cm. Dominant simple cyst measuring 2.5 x 3.3 x 2.2 cm. Ovary otherwise unremarkable. No adnexal mass.  Other findings  Small amount pelvic free fluid.  IMPRESSION: 1. No convincing acute finding. 2. Uterus enlarged primarily by dominant fundal fibroid measuring 11.5 cm in greatest dimension. 3. Complex fluid collection distends the endometrial canal measuring 3.5 x 2.1 x 2.3 cm. This could be due to products of hemorrhage but is nonspecific. 4. Dominant simple appearing left ovarian cyst. Left ovary otherwise unremarkable. Right ovary not visualized. No adnexal masses.  A/P 50 yo with pelvic pain and fibroid uterus - Patient with a 2 cm increase in the size of her fibroid since 2013 - 2 cm cyst unlikely the source of her pain - Advised to continue with ibuprofen/percocet and heating pad for pain - Patient considering hysterectomy but is not certain at this time - follow up as scheduled on march 16 if pain remains unchanged - Urine culture sent

## 2015-06-29 ENCOUNTER — Encounter: Payer: Self-pay | Admitting: Obstetrics & Gynecology

## 2015-06-29 ENCOUNTER — Ambulatory Visit (INDEPENDENT_AMBULATORY_CARE_PROVIDER_SITE_OTHER): Payer: BC Managed Care – PPO | Admitting: Obstetrics & Gynecology

## 2015-06-29 VITALS — BP 104/69 | HR 91 | Wt 173.0 lb

## 2015-06-29 DIAGNOSIS — D219 Benign neoplasm of connective and other soft tissue, unspecified: Secondary | ICD-10-CM

## 2015-06-29 DIAGNOSIS — N83209 Unspecified ovarian cyst, unspecified side: Secondary | ICD-10-CM

## 2015-06-29 DIAGNOSIS — D252 Subserosal leiomyoma of uterus: Secondary | ICD-10-CM

## 2015-06-29 HISTORY — DX: Benign neoplasm of connective and other soft tissue, unspecified: D21.9

## 2015-06-29 NOTE — Progress Notes (Signed)
CLINIC ENCOUNTER NOTE  History:  50 y.o. G1P1 here today for follow up after being seen here on 06/19/2015 for pelvic pain and newly diagnosed 2 xm simple ovarian cyst.  She reports that her pain is now minimal, no pelvic pressure.  She denies any abnormal vaginal discharge, bleeding or other concerns.   Past Medical History  Diagnosis Date  . Herpes   . Vaginal discharge   . Anemia     on iron pills    Past Surgical History  Procedure Laterality Date  . Svd  1999    x 1  . Wisdom tooth extraction    . Right wrist surgery      The following portions of the patient's history were reviewed and updated as appropriate: allergies, current medications, past family history, past medical history, past social history, past surgical history and problem list.   Health Maintenance:  Normal pap and negative HRHPV on 12/13/2013.  Normal mammogram on 06/06/2014.   Review of Systems:  Pertinent items noted in HPI and remainder of comprehensive ROS otherwise negative.  Objective:  Physical Exam BP 104/69 mmHg  Pulse 91  Wt 173 lb (78.472 kg) CONSTITUTIONAL: Well-developed, well-nourished female in no acute distress.  HENT:  Normocephalic, atraumatic. External right and left ear normal. Oropharynx is clear and moist EYES: Conjunctivae and EOM are normal. Pupils are equal, round, and reactive to light. No scleral icterus.  NECK: Normal range of motion, supple, no masses SKIN: Skin is warm and dry. No rash noted. Not diaphoretic. No erythema. No pallor. NEUROLOGIC: Alert and oriented to person, place, and time. Normal reflexes, muscle tone coordination. No cranial nerve deficit noted. PSYCHIATRIC: Normal mood and affect. Normal behavior. Normal judgment and thought content. CARDIOVASCULAR: Normal heart rate noted RESPIRATORY: Effort and breath sounds normal, no problems with respiration noted ABDOMEN: Soft, no distention noted.   PELVIC: Deferred MUSCULOSKELETAL: Normal range of motion. No  edema noted.  Labs and Imaging US Transvaginal Non-ob  06/16/2015  CLINICAL DATA:  Followup from abnormal CT scan. This showed prominence of the endometrial canal and uterine enlargement by presumed fibroids. Patient underwent CT scan for left lower quadrant pain since yesterday. EXAM: TRANSABDOMINAL AND TRANSVAGINAL ULTRASOUND OF PELVIS TECHNIQUE: Both transabdominal and transvaginal ultrasound examinations of the pelvis were performed. Transabdominal technique was performed for global imaging of the pelvis including uterus, ovaries, adnexal regions, and pelvic cul-de-sac. It was necessary to proceed with endovaginal exam following the transabdominal exam to visualize the endometrium and ovaries and adnexa. COMPARISON:  Current abdomen pelvis CT FINDINGS: Uterus Measurements: 11.1 x 5.5 x 8.6 cm. Fall large exophytic fibroid from the uterine fundus measuring 11.5 x 7.8 x 9.5 cm heterogeneous echogenicity area noted along the right margin of the mid uterine segment chills likely an additional fibroid. It measures 3.5 x 2.5 x 2.9 cm. The cervix is unremarkable. Endometrium Thickness: 14 mm. A complex fluid collection distends the upper uterine segment endometrial canal measuring 3.5 x 2.1 x 2.3 cm. No endometrial mass. Right ovary Not visualized.  No adnexal mass. Left ovary Measurements: 2.8 x 3.5 x 2.2 cm. Dominant simple cyst measuring 2.5 x 3.3 x 2.2 cm. Ovary otherwise unremarkable. No adnexal mass. Other findings Small amount pelvic free fluid. IMPRESSION: 1. No convincing acute finding. 2. Uterus enlarged primarily by dominant fundal fibroid measuring 11.5 cm in greatest dimension. 3. Complex fluid collection distends the endometrial canal measuring 3.5 x 2.1 x 2.3 cm. This could be due to products of hemorrhage but  is nonspecific. 4. Dominant simple appearing left ovarian cyst. Left ovary otherwise unremarkable. Right ovary not visualized. No adnexal masses. Electronically Signed   By: Lajean Manes M.D.    On: 06/16/2015 15:35   US Pelvis Complete  06/16/2015  CLINICAL DATA:  Followup from abnormal CT scan. This showed prominence of the endometrial canal and uterine enlargement by presumed fibroids. Patient underwent CT scan for left lower quadrant pain since yesterday. EXAM: TRANSABDOMINAL AND TRANSVAGINAL ULTRASOUND OF PELVIS TECHNIQUE: Both transabdominal and transvaginal ultrasound examinations of the pelvis were performed. Transabdominal technique was performed for global imaging of the pelvis including uterus, ovaries, adnexal regions, and pelvic cul-de-sac. It was necessary to proceed with endovaginal exam following the transabdominal exam to visualize the endometrium and ovaries and adnexa. COMPARISON:  Current abdomen pelvis CT FINDINGS: Uterus Measurements: 11.1 x 5.5 x 8.6 cm. Fall large exophytic fibroid from the uterine fundus measuring 11.5 x 7.8 x 9.5 cm heterogeneous echogenicity area noted along the right margin of the mid uterine segment chills likely an additional fibroid. It measures 3.5 x 2.5 x 2.9 cm. The cervix is unremarkable. Endometrium Thickness: 14 mm. A complex fluid collection distends the upper uterine segment endometrial canal measuring 3.5 x 2.1 x 2.3 cm. No endometrial mass. Right ovary Not visualized.  No adnexal mass. Left ovary Measurements: 2.8 x 3.5 x 2.2 cm. Dominant simple cyst measuring 2.5 x 3.3 x 2.2 cm. Ovary otherwise unremarkable. No adnexal mass. Other findings Small amount pelvic free fluid. IMPRESSION: 1. No convincing acute finding. 2. Uterus enlarged primarily by dominant fundal fibroid measuring 11.5 cm in greatest dimension. 3. Complex fluid collection distends the endometrial canal measuring 3.5 x 2.1 x 2.3 cm. This could be due to products of hemorrhage but is nonspecific. 4. Dominant simple appearing left ovarian cyst. Left ovary otherwise unremarkable. Right ovary not visualized. No adnexal masses. Electronically Signed   By: Lajean Manes M.D.   On:  06/16/2015 15:35   Ct Abdomen Pelvis W Contrast  06/16/2015  CLINICAL DATA:  Left lower quadrant pain since yesterday EXAM: CT ABDOMEN AND PELVIS WITH CONTRAST TECHNIQUE: Multidetector CT imaging of the abdomen and pelvis was performed using the standard protocol following bolus administration of intravenous contrast. CONTRAST:  129mL OMNIPAQUE IOHEXOL 350 MG/ML SOLN COMPARISON:  None. FINDINGS: Lung bases are free of acute infiltrate or sizable effusion. The liver, gallbladder, spleen, adrenal glands and pancreas are within normal limits. The kidneys are well visualized and reveal no renal calculi or urinary tract obstructive changes. Delayed images demonstrate normal excretion of contrast material. The appendix is within normal limits. The uterus is well visualized and directly above the uterus there is a mixed attenuation lesion which measures approximately 11.4 by 7.1 cm in greatest transverse and AP dimensions Respectively. It measures approximately 9.6 cm in craniocaudad projection. It appears to be exophytic from the fundus of the uterus and would be consistent with a large uterine fibroid. Previous plain film examination from 2012 demonstrates some suggestion of increased density in retrospect. The uterus also demonstrates prominent endometrial canal with fluid within. This measures at least 2.1 cm in greatest dimension. Some nabothian cysts are also noted. A left ovarian cyst is seen which measures 3.8 cm in greatest dimension. A small rounded area of fluid is noted on image number 58 of series 5 which may represent a right ovarian cyst. It measures 2 cm in greatest dimension. No acute bony abnormality is seen. IMPRESSION: Changes suggestive of large exophytic uterine fibroid from  the uterine fundus as described. Prominence of the endometrial canal is also noted. Ultrasound of the pelvis may be helpful for clarification. Bilateral ovarian cystic change. No other acute abnormality is noted. Electronically  Signed   By: Inez Catalina M.D.   On: 06/16/2015 12:17    Assessment & Plan:  1. Subserous leiomyoma of uterus 2. Simple ovarian cyst Pain is mostly resolved, likely secondary to physiologic cyst. No need for surgical intervention. If there are any problems attributable to the fibroids, will discuss further management at that point.  Routine preventative health maintenance measures emphasized. Please refer to After Visit Summary for other counseling recommendations.    Total face-to-face time with patient: 15 minutes. Over 50% of encounter was spent on counseling and coordination of care.   Verita Schneiders, MD, [place] Attending Obstetrician & Gynecologist, [place] for [facility]

## 2015-07-05 ENCOUNTER — Other Ambulatory Visit: Payer: Self-pay | Admitting: Obstetrics & Gynecology

## 2015-07-21 ENCOUNTER — Ambulatory Visit (INDEPENDENT_AMBULATORY_CARE_PROVIDER_SITE_OTHER): Payer: BC Managed Care – PPO | Admitting: Obstetrics & Gynecology

## 2015-07-21 ENCOUNTER — Encounter: Payer: Self-pay | Admitting: Obstetrics & Gynecology

## 2015-07-21 VITALS — BP 94/61 | HR 80 | Resp 18 | Wt 173.0 lb

## 2015-07-21 DIAGNOSIS — D252 Subserosal leiomyoma of uterus: Secondary | ICD-10-CM

## 2015-07-21 DIAGNOSIS — N939 Abnormal uterine and vaginal bleeding, unspecified: Secondary | ICD-10-CM | POA: Diagnosis not present

## 2015-07-21 NOTE — Progress Notes (Signed)
CLINIC ENCOUNTER NOTE  History:  50 y.o. G1P1 here today for evaluation of one episode of vaginal bleeding lasting 5 days. Reports it being heavy, but did not soak 1 pad/hour or have any symptoms of anemia. She is worried as this is first episode of bleeding since her HTA procedure in 2013.  Reports occasional discomfort due to exophytic fibroid.   She denies any current abnormal vaginal discharge, bleeding or other concerns.   Past Medical History  Diagnosis Date  . Herpes   . Vaginal discharge   . Anemia     on iron pills    Past Surgical History  Procedure Laterality Date  . Wisdom tooth extraction    . Right wrist surgery    . Hysteroscopy w/ endometrial ablation  07/09/2011    HTA    The following portions of the patient's history were reviewed and updated as appropriate: allergies, current medications, past family history, past medical history, past social history, past surgical history and problem list.   Health Maintenance:  Normal pap and negative HRHPV on 12/13/2013. Normal mammogram on 06/06/2014.    Review of Systems:  Pertinent items noted in HPI and remainder of comprehensive ROS otherwise negative.  Objective:  Physical Exam BP 94/61 mmHg  Pulse 80  Resp 18  Wt 173 lb (78.472 kg) CONSTITUTIONAL: Well-developed, well-nourished female in no acute distress.  HENT:  Normocephalic, atraumatic. External right and left ear normal. Oropharynx is clear and moist EYES: Conjunctivae and EOM are normal. Pupils are equal, round, and reactive to light. No scleral icterus.  NECK: Normal range of motion, supple, no masses SKIN: Skin is warm and dry. No rash noted. Not diaphoretic. No erythema. No pallor. NEUROLOGIC: Alert and oriented to person, place, and time. Normal reflexes, muscle tone coordination. No cranial nerve deficit noted. PSYCHIATRIC: Normal mood and affect. Normal behavior. Normal judgment and thought content. CARDIOVASCULAR: Normal heart rate  noted RESPIRATORY: Effort and breath sounds normal, no problems with respiration noted ABDOMEN: Soft, no distention noted.  Fibroid uterus palpated. PELVIC: Normal appearing external genitalia; normal appearing vaginal mucosa and cervix.  No abnormal discharge noted.  Enlarged fibroid uterus palpated, no uterine or adnexal tenderness. MUSCULOSKELETAL: Normal range of motion. No edema noted.  Labs and Imaging  06/16/2015 CLINICAL DATA: Followup from abnormal CT scan. This showed prominence of the endometrial canal and uterine enlargement by presumed fibroids. Patient underwent CT scan for left lower quadrant pain since yesterday. EXAM: TRANSABDOMINAL AND TRANSVAGINAL ULTRASOUND OF PELVIS TECHNIQUE: Both transabdominal and transvaginal ultrasound examinations of the pelvis were performed. Transabdominal technique was performed for global imaging of the pelvis including uterus, ovaries, adnexal regions, and pelvic cul-de-sac. It was necessary to proceed with endovaginal exam following the transabdominal exam to visualize the endometrium and ovaries and adnexa. COMPARISON: Current abdomen pelvis CT FINDINGS: Uterus Measurements: 11.1 x 5.5 x 8.6 cm. Fall large exophytic fibroid from the uterine fundus measuring 11.5 x 7.8 x 9.5 cm heterogeneous echogenicity area noted along the right margin of the mid uterine segment chills likely an additional fibroid. It measures 3.5 x 2.5 x 2.9 cm. The cervix is unremarkable. Endometrium Thickness: 14 mm. A complex fluid collection distends the upper uterine segment endometrial canal measuring 3.5 x 2.1 x 2.3 cm. No endometrial mass. Right ovary Not visualized. No adnexal mass. Left ovary Measurements: 2.8 x 3.5 x 2.2 cm. Dominant simple cyst measuring 2.5 x 3.3 x 2.2 cm. Ovary otherwise unremarkable. No adnexal mass. Other findings Small amount pelvic free  fluid. IMPRESSION: 1. No convincing acute finding. 2. Uterus enlarged primarily by dominant fundal fibroid measuring  11.5 cm in greatest dimension. 3. Complex fluid collection distends the endometrial canal measuring 3.5 x 2.1 x 2.3 cm. This could be due to products of hemorrhage but is nonspecific. 4. Dominant simple appearing left ovarian cyst. Left ovary otherwise unremarkable. Right ovary not visualized. No adnexal masses. Electronically Signed By: Lajean Manes M.D. On: 06/16/2015 15:35   Assessment & Plan:  1. Abnormal uterine bleeding (AUB) 2. Subserous leiomyoma of uterus First episode of bleeding, patient reassured.  If AUB continues, become erratic or heavier and patient desires intervention, will recommend hysterectomy.  She is declining this for now.  Other options: progestin therapy, repeat HTA (will not help with fibroid).  Patient will consider options. Expectant management for now. Routine preventative health maintenance measures emphasized. Please refer to After Visit Summary for other counseling recommendations.    Total face-to-face time with patient: 15 minutes. Over 50% of encounter was spent on counseling and coordination of care.   Verita Schneiders, MD, [place] Attending Obstetrician & Gynecologist, [place] for [facility]

## 2015-07-21 NOTE — Progress Notes (Signed)
Pt here today c/o vaginal bleeding that started last week that was very heavy and lasted for 5 days.

## 2015-09-20 ENCOUNTER — Encounter: Payer: Self-pay | Admitting: Obstetrics & Gynecology

## 2015-09-20 ENCOUNTER — Ambulatory Visit (INDEPENDENT_AMBULATORY_CARE_PROVIDER_SITE_OTHER): Payer: BC Managed Care – PPO | Admitting: Obstetrics & Gynecology

## 2015-09-20 VITALS — BP 106/68 | HR 81 | Resp 16 | Ht 66.0 in | Wt 160.0 lb

## 2015-09-20 DIAGNOSIS — D252 Subserosal leiomyoma of uterus: Secondary | ICD-10-CM

## 2015-09-20 DIAGNOSIS — Z803 Family history of malignant neoplasm of breast: Secondary | ICD-10-CM

## 2015-09-20 DIAGNOSIS — Z1231 Encounter for screening mammogram for malignant neoplasm of breast: Secondary | ICD-10-CM

## 2015-09-20 DIAGNOSIS — Z8041 Family history of malignant neoplasm of ovary: Secondary | ICD-10-CM | POA: Diagnosis not present

## 2015-09-20 DIAGNOSIS — R634 Abnormal weight loss: Secondary | ICD-10-CM | POA: Diagnosis not present

## 2015-09-20 DIAGNOSIS — N939 Abnormal uterine and vaginal bleeding, unspecified: Secondary | ICD-10-CM | POA: Diagnosis not present

## 2015-09-20 NOTE — Patient Instructions (Signed)
Return to clinic for any scheduled appointments or for any gynecologic concerns as needed.   

## 2015-09-20 NOTE — Progress Notes (Signed)
CLINIC ENCOUNTER NOTE  History:  50 y.o. G1P1 here today for evaluation of second episode of vaginal bleeding lasting 5 days; first episode of bleeding was in 07/2015. Reports it being normal flow like a regular period, did not soak 1 pad/hour or have any symptoms of anemia. She is worried as this is second episode of bleeding since her HTA procedure in 2013. Reports occasional discomfort due to her known large 11 cm exophytic fibroid. She denies any current abnormal vaginal discharge, bleeding but reports 20 pound unintentional weight loss since January.  She did start a new job in January that involves being more active but she is worried about the weight loss.  Also reports feeling hot most of the time.  She has a FH of ovarian cancer in her mother and breast cancer in her grandmother, she is concerned that the bleeding and weight loss may be signs of cancer.  She also had a 3 cm simple cyst on 06/2015 ultrasound that she is concerned about. No pain anywhere, no bloating, no distention.    Past Medical History  Diagnosis Date  . Herpes   . Vaginal discharge   . Anemia     on iron pills  . Fibroids 06/29/2015    06/16/2015 Pelvic Ultrasound Uterus Measurements: 11.1 x 5.5 x 8.6 cm. Large exophytic fibroid from the uterine fundus measuring 11.5 x 7.8 x 9.5 cm heterogeneous echogenicity area noted along the right margin of the mid uterine segment chills likely an additional fibroid. It measures 3.5 x 2.5 x 2.9 cm. The cervix is unremarkable.     Past Surgical History  Procedure Laterality Date  . Wisdom tooth extraction    . Right wrist surgery    . Hysteroscopy w/ endometrial ablation  07/09/2011    HTA    The following portions of the patient's history were reviewed and updated as appropriate: allergies, current medications, past family history, past medical history, past social history, past surgical history and problem list.   Health Maintenance: Normal pap and negative HRHPV on  12/13/2013. Normal mammogram on 06/06/2014.  Review of Systems:  Pertinent items noted in HPI and remainder of comprehensive ROS otherwise negative.  Objective:  Physical Exam BP 106/68 mmHg  Pulse 81  Resp 16  Ht 5\' 6"  (1.676 m)  Wt 160 lb (72.576 kg)  BMI 25.84 kg/m2  LMP 09/16/2015 CONSTITUTIONAL: Well-developed, well-nourished female in no acute distress.  HENT:  Normocephalic, atraumatic, External right and left ear normal. Oropharynx is clear and moist EYES: Conjunctivae and EOM are normal. Pupils are equal, round, and reactive to light. No scleral icterus.  NECK: Normal range of motion, supple, no masses.  Normal thyroid.  SKIN: Skin is warm and dry. No rash noted. Not diaphoretic. No erythema. No pallor. NEUROLOGIC: Alert and oriented to person, place, and time. Normal reflexes, muscle tone coordination. No cranial nerve deficit noted. PSYCHIATRIC: Normal mood and affect. Normal behavior. Normal judgment and thought content. CARDIOVASCULAR: Normal heart rate noted. RESPIRATORY: Effort and breath sounds normal, no problems with respiration noted. BREASTS: Symmetric in size. No masses, skin changes, nipple drainage, or lymphadenopathy. ABDOMEN: Soft, normal bowel sounds, no distention noted.  No tenderness, rebound or guarding.  PELVIC: Deferred today. On 07/21/2015 exam: Normal appearing external genitalia; normal appearing vaginal mucosa and cervix. No abnormal discharge noted. Enlarged fibroid uterus palpated, no uterine or adnexal tenderness MUSCULOSKELETAL: Normal range of motion. No tenderness.  No cyanosis, clubbing, or edema.  2+ distal pulses.  Labs and  Imaging 06/16/2015 CLINICAL DATA: Followup from abnormal CT scan. This showed prominence of the endometrial canal and uterine enlargement by presumed fibroids. Patient underwent CT scan for left lower quadrant pain since yesterday. EXAM: TRANSABDOMINAL AND TRANSVAGINAL ULTRASOUND OF PELVIS TECHNIQUE: Both transabdominal and  transvaginal ultrasound examinations of the pelvis were performed. Transabdominal technique was performed for global imaging of the pelvis including uterus, ovaries, adnexal regions, and pelvic cul-de-sac. It was necessary to proceed with endovaginal exam following the transabdominal exam to visualize the endometrium and ovaries and adnexa. COMPARISON: Current abdomen pelvis CT FINDINGS: Uterus Measurements: 11.1 x 5.5 x 8.6 cm. Fall large exophytic fibroid from the uterine fundus measuring 11.5 x 7.8 x 9.5 cm heterogeneous echogenicity area noted along the right margin of the mid uterine segment chills likely an additional fibroid. It measures 3.5 x 2.5 x 2.9 cm. The cervix is unremarkable. Endometrium Thickness: 14 mm. A complex fluid collection distends the upper uterine segment endometrial canal measuring 3.5 x 2.1 x 2.3 cm. No endometrial mass. Right ovary Not visualized. No adnexal mass. Left ovary Measurements: 2.8 x 3.5 x 2.2 cm. Dominant simple cyst measuring 2.5 x 3.3 x 2.2 cm. Ovary otherwise unremarkable. No adnexal mass. Other findings Small amount pelvic free fluid. IMPRESSION: 1. No convincing acute finding. 2. Uterus enlarged primarily by dominant fundal fibroid measuring 11.5 cm in greatest dimension. 3. Complex fluid collection distends the endometrial canal measuring 3.5 x 2.1 x 2.3 cm. This could be due to products of hemorrhage but is nonspecific. 4. Dominant simple appearing left ovarian cyst. Left ovary otherwise unremarkable. Right ovary not visualized. No adnexal masses. Electronically Signed By: Lajean Manes M.D. On: 06/16/2015 15:35    Assessment & Plan:  1. Abnormal uterine bleeding (AUB) Patient has two episodes of menstrual bleeding in the last 3 months. She was told this can happen given that she is 4 years s/p HTA.   If AUB continues, become erratic or heavier and patient desires intervention, will recommend hysterectomy. She is declining this for now. Other options:  progestin therapy, repeat HTA (will not help with fibroid). Patient will consider options. Expectant management for now. Will check some labs and repeat imaging with CT scan (also because of unintentional weight loss) - TSH - Follicle stimulating hormone - hCG, quantitative, pregnancy - CT Abdomen Pelvis W Contrast; Future  2. Subserous leiomyoma of uterus Minimal pain for now. Will check size progression on CT scan  3. Abnormal intentional weight loss No over physical exam findings. Given FH of ovarian cancer, will check CA 125.  Will also follow up CT scan and TSH. - TSH - CA 125 - CT Abdomen Pelvis W Contrast; Future  4. FH: ovarian cancer in first degree relative - CA 125 - CT Abdomen Pelvis W Contrast; Future  5. FH: breast cancer 6. Visit for screening mammogram - MM DIGITAL SCREENING BILATERAL; Future ordered. Had normal breast exam today. Routine preventative health maintenance measures emphasized. Please refer to After Visit Summary for other counseling recommendations.   Return if symptoms worsen or fail to improve.   Total face-to-face time with patient: 25 minutes. Over 50% of encounter was spent on counseling and coordination of care.   Verita Schneiders, MD, [place] Attending Obstetrician & Gynecologist, [place] for [facility]

## 2015-09-21 LAB — TSH: TSH: 0.9 m[IU]/L

## 2015-09-21 LAB — HCG, QUANTITATIVE, PREGNANCY

## 2015-09-21 LAB — FOLLICLE STIMULATING HORMONE: FSH: 21.2 m[IU]/mL

## 2015-09-21 LAB — CA 125: CA 125: 11 U/mL (ref <35)

## 2015-09-27 ENCOUNTER — Ambulatory Visit
Admission: RE | Admit: 2015-09-27 | Discharge: 2015-09-27 | Disposition: A | Discharge status: Home / Self Care | Payer: BC Managed Care – PPO | Source: Ambulatory Visit | Attending: Obstetrics & Gynecology | Admitting: Obstetrics & Gynecology

## 2015-09-27 DIAGNOSIS — Z1231 Encounter for screening mammogram for malignant neoplasm of breast: Secondary | ICD-10-CM

## 2015-09-27 DIAGNOSIS — Z8041 Family history of malignant neoplasm of ovary: Secondary | ICD-10-CM

## 2015-09-27 DIAGNOSIS — R634 Abnormal weight loss: Secondary | ICD-10-CM

## 2015-09-27 DIAGNOSIS — N939 Abnormal uterine and vaginal bleeding, unspecified: Secondary | ICD-10-CM

## 2015-09-27 MED ORDER — IOPAMIDOL (ISOVUE-300) INJECTION 61%
100.0000 mL | Freq: Once | INTRAVENOUS | Status: AC | PRN
  Administered 2015-09-27: 100 mL via INTRAVENOUS

## 2015-09-28 ENCOUNTER — Ambulatory Visit: Payer: BC Managed Care – PPO

## 2015-10-03 ENCOUNTER — Other Ambulatory Visit: Payer: Self-pay | Admitting: Obstetrics & Gynecology

## 2015-10-03 DIAGNOSIS — R928 Other abnormal and inconclusive findings on diagnostic imaging of breast: Secondary | ICD-10-CM

## 2015-10-11 ENCOUNTER — Ambulatory Visit
Admission: RE | Admit: 2015-10-11 | Discharge: 2015-10-11 | Disposition: A | Discharge status: Home / Self Care | Payer: BC Managed Care – PPO | Source: Ambulatory Visit | Attending: Obstetrics & Gynecology | Admitting: Obstetrics & Gynecology

## 2015-10-11 DIAGNOSIS — R928 Other abnormal and inconclusive findings on diagnostic imaging of breast: Secondary | ICD-10-CM

## 2015-10-23 ENCOUNTER — Telehealth: Payer: Self-pay | Admitting: *Deleted

## 2015-10-23 NOTE — Telephone Encounter (Signed)
Pt called requesting results from CT scan, informed her that fibroid uterus was noted as compared to March scan and labs were normal as well.  Pt to follow-up as needed if there is any changes.

## 2015-12-12 ENCOUNTER — Telehealth: Payer: Self-pay | Admitting: *Deleted

## 2015-12-12 DIAGNOSIS — B009 Herpesviral infection, unspecified: Secondary | ICD-10-CM

## 2015-12-12 MED ORDER — VALACYCLOVIR HCL 500 MG PO TABS: 500.0000 mg | ORAL_TABLET | Freq: Every day | ORAL | 3 refills | Status: DC | PRN

## 2015-12-12 NOTE — Telephone Encounter (Signed)
-----   Message from Francia Greaves sent at 12/12/2015 10:45 AM EDT ----- Regarding: Refill Reuqest Contact: [phone] Wants a refill on her Valtrex Uses [facility] in [place]

## 2015-12-19 ENCOUNTER — Other Ambulatory Visit: Payer: Self-pay | Admitting: Obstetrics & Gynecology

## 2015-12-19 DIAGNOSIS — B009 Herpesviral infection, unspecified: Secondary | ICD-10-CM

## 2016-03-13 ENCOUNTER — Other Ambulatory Visit: Payer: Self-pay | Admitting: Obstetrics & Gynecology

## 2016-09-12 ENCOUNTER — Other Ambulatory Visit: Payer: Self-pay | Admitting: Obstetrics & Gynecology

## 2016-09-12 DIAGNOSIS — Z1231 Encounter for screening mammogram for malignant neoplasm of breast: Secondary | ICD-10-CM

## 2016-09-30 ENCOUNTER — Ambulatory Visit
Admission: RE | Admit: 2016-09-30 | Discharge: 2016-09-30 | Disposition: A | Discharge status: Home / Self Care | Payer: BC Managed Care – PPO | Source: Ambulatory Visit | Attending: Obstetrics & Gynecology | Admitting: Obstetrics & Gynecology

## 2016-09-30 DIAGNOSIS — Z1231 Encounter for screening mammogram for malignant neoplasm of breast: Secondary | ICD-10-CM

## 2016-10-01 ENCOUNTER — Encounter: Payer: Self-pay | Admitting: Obstetrics & Gynecology

## 2016-10-01 ENCOUNTER — Ambulatory Visit (INDEPENDENT_AMBULATORY_CARE_PROVIDER_SITE_OTHER): Payer: BC Managed Care – PPO | Admitting: Obstetrics & Gynecology

## 2016-10-01 VITALS — BP 103/62 | HR 76 | Ht 66.0 in | Wt 159.0 lb

## 2016-10-01 DIAGNOSIS — Z01419 Encounter for gynecological examination (general) (routine) without abnormal findings: Secondary | ICD-10-CM | POA: Diagnosis not present

## 2016-10-01 DIAGNOSIS — D252 Subserosal leiomyoma of uterus: Secondary | ICD-10-CM

## 2016-10-01 DIAGNOSIS — D250 Submucous leiomyoma of uterus: Secondary | ICD-10-CM

## 2016-10-01 DIAGNOSIS — N912 Amenorrhea, unspecified: Secondary | ICD-10-CM

## 2016-10-01 NOTE — Progress Notes (Signed)
GYNECOLOGY ANNUAL PREVENTATIVE CARE ENCOUNTER NOTE  Subjective:   [name] is a 51 y.o. G1P1 female here for a routine annual gynecologic exam.  Current complaints: none.  Still having light periods q 2-3 months, occasional pain due to large exophytic fibroid and wants updated ultrasound for surveillance.   Denies abnormal vaginal bleeding, discharge, pelvic pain, problems with intercourse or other gynecologic concerns.    Gynecologic History Patient's last menstrual period was 08/01/2016. Last Pap: 12/13/2013. Results were: normal Last mammogram: 10/01/2015. Results were: normal  Obstetric History OB History  Gravida Para Term Preterm AB Living  1 1          SAB TAB Ectopic Multiple Live Births               # Outcome Date GA Lbr Len/2nd Weight Sex Delivery Anes PTL Lv  1 Para               Past Medical History:  Diagnosis Date  . Anemia    on iron pills  . Fibroids 06/29/2015   06/16/2015 Pelvic Ultrasound Uterus Measurements: 11.1 x 5.5 x 8.6 cm. Large exophytic fibroid from the uterine fundus measuring 11.5 x 7.8 x 9.5 cm heterogeneous echogenicity area noted along the right margin of the mid uterine segment chills likely an additional fibroid. It measures 3.5 x 2.5 x 2.9 cm. The cervix is unremarkable.   Marland Kitchen Herpes   . Vaginal discharge     Past Surgical History:  Procedure Laterality Date  . HYSTEROSCOPY W/ ENDOMETRIAL ABLATION  07/09/2011   HTA  . Right Wrist Surgery    . WISDOM TOOTH EXTRACTION      Current Outpatient Prescriptions on File Prior to Visit  Medication Sig Dispense Refill  . ferrous sulfate 325 (65 FE) MG tablet TAKE 1 TABLET (325 MG TOTAL) BY MOUTH 2 (TWO) TIMES DAILY WITH A MEAL. 60 tablet 8  . fexofenadine (ALLEGRA) 180 MG tablet Take 180 mg by mouth. Reported on 04/16/4578    . folic acid (FOLVITE) 1 MG tablet Take 1 mg by mouth daily.    . methotrexate (RHEUMATREX) 2.5 MG tablet Take 2.5 mg by mouth once a week. Reported on 07/21/2015    .  triamcinolone cream (KENALOG) 0.1 % Apply topically. Reported on 09/20/2015    . valACYclovir (VALTREX) 500 MG tablet Take 1 tablet (500 mg total) by mouth daily as needed. For outbreaks 30 tablet 3  . etodolac (LODINE) 500 MG tablet Take 500 mg by mouth.    . [DISCONTINUED] medroxyPROGESTERone (PROVERA) 10 MG tablet Take 2 tablets (20 mg total) by mouth daily. 30 tablet 2   No current facility-administered medications on file prior to visit.     Allergies  Allergen Reactions  . Latex Hives    Social History   Social History  . Marital status: Single    Spouse name: N/A  . Number of children: N/A  . Years of education: N/A   Occupational History  . Not on file.   Social History Main Topics  . Smoking status: Never Smoker  . Smokeless tobacco: Never Used  . Alcohol use No  . Drug use: No  . Sexual activity: Yes    Partners: Male    Birth control/ protection: None     Comment: boyfriend has had a vasectomy per pt.   Other Topics Concern  . Not on file   Social History Narrative  . No narrative on file    Family  History  Problem Relation Age of Onset  . Cancer Mother 18       ovarian  . Breast cancer Mother   . Cancer Paternal Grandmother        BREAST  . Breast cancer Maternal Grandmother     The following portions of the patient's history were reviewed and updated as appropriate: allergies, current medications, past family history, past medical history, past social history, past surgical history and problem list.  Review of Systems Pertinent items noted in HPI and remainder of comprehensive ROS otherwise negative.   Objective:  BP 103/62   Pulse 76   Ht 5\' 6"  (1.676 m)   Wt 159 lb (72.1 kg)   LMP 08/01/2016   BMI 25.66 kg/m  CONSTITUTIONAL: Well-developed, well-nourished female in no acute distress.  HENT:  Normocephalic, atraumatic, External right and left ear normal. Oropharynx is clear and moist EYES: Conjunctivae and EOM are normal. Pupils are equal,  round, and reactive to light. No scleral icterus.  NECK: Normal range of motion, supple, no masses.  Normal thyroid.  SKIN: Skin is warm and dry. No rash noted. Not diaphoretic. No erythema. No pallor. NEUROLOGIC: Alert and oriented to person, place, and time. Normal reflexes, muscle tone coordination. No cranial nerve deficit noted. PSYCHIATRIC: Normal mood and affect. Normal behavior. Normal judgment and thought content. CARDIOVASCULAR: Normal heart rate noted, regular rhythm RESPIRATORY: Clear to auscultation bilaterally. Effort and breath sounds normal, no problems with respiration noted. BREASTS: Symmetric in size. No masses, skin changes, nipple drainage, or lymphadenopathy. ABDOMEN: Soft, normal bowel sounds, no distention noted.  No tenderness, rebound or guarding.  PELVIC: Normal appearing external genitalia; normal appearing vaginal mucosa and cervix.  No abnormal discharge noted.  Pap smear obtained.  Enlarged fibroid uterus palpated, no uterine or adnexal tenderness. MUSCULOSKELETAL: Normal range of motion. No tenderness.  No cyanosis, clubbing, or edema.  2+ distal pulses.   Assessment:  Annual gynecologic examination with pap smear Fibroids   Plan:  Will follow up results of pap smear and manage accordingly. Mammogram is up to date Ultrasound ordered for fibroids surveillance Routine preventative health maintenance measures emphasized. Please refer to After Visit Summary for other counseling recommendations.    Verita Schneiders, MD, [place] Attending Obstetrician & Gynecologist, [place] for [facility]

## 2016-10-01 NOTE — Progress Notes (Signed)
Having some abdominal tenderness. Last period two months ago.  Has had ablation in the past.

## 2016-10-01 NOTE — Patient Instructions (Signed)
Preventive Care 40-64 Years, Female Preventive care refers to lifestyle choices and visits with your health care provider that can promote health and wellness. What does preventive care include?  A yearly physical exam. This is also called an annual well check.  Dental exams once or twice a year.  Routine eye exams. Ask your health care provider how often you should have your eyes checked.  Personal lifestyle choices, including: ? Daily care of your teeth and gums. ? Regular physical activity. ? Eating a healthy diet. ? Avoiding tobacco and drug use. ? Limiting alcohol use. ? Practicing safe sex. ? Taking low-dose aspirin daily starting at age 58. ? Taking vitamin and mineral supplements as recommended by your health care provider. What happens during an annual well check? The services and screenings done by your health care provider during your annual well check will depend on your age, overall health, lifestyle risk factors, and family history of disease. Counseling Your health care provider may ask you questions about your:  Alcohol use.  Tobacco use.  Drug use.  Emotional well-being.  Home and relationship well-being.  Sexual activity.  Eating habits.  Work and work Statistician.  Method of birth control.  Menstrual cycle.  Pregnancy history.  Screening You may have the following tests or measurements:  Height, weight, and BMI.  Blood pressure.  Lipid and cholesterol levels. These may be checked every 5 years, or more frequently if you are over 81 years old.  Skin check.  Lung cancer screening. You may have this screening every year starting at age 78 if you have a 30-pack-year history of smoking and currently smoke or have quit within the past 15 years.  Fecal occult blood test (FOBT) of the stool. You may have this test every year starting at age 65.  Flexible sigmoidoscopy or colonoscopy. You may have a sigmoidoscopy every 5 years or a colonoscopy  every 10 years starting at age 30.  Hepatitis C blood test.  Hepatitis B blood test.  Sexually transmitted disease (STD) testing.  Diabetes screening. This is done by checking your blood sugar (glucose) after you have not eaten for a while (fasting). You may have this done every 1-3 years.  Mammogram. This may be done every 1-2 years. Talk to your health care provider about when you should start having regular mammograms. This may depend on whether you have a family history of breast cancer.  BRCA-related cancer screening. This may be done if you have a family history of breast, ovarian, tubal, or peritoneal cancers.  Pelvic exam and Pap test. This may be done every 3 years starting at age 80. Starting at age 36, this may be done every 5 years if you have a Pap test in combination with an HPV test.  Bone density scan. This is done to screen for osteoporosis. You may have this scan if you are at high risk for osteoporosis.  Discuss your test results, treatment options, and if necessary, the need for more tests with your health care provider. Vaccines Your health care provider may recommend certain vaccines, such as:  Influenza vaccine. This is recommended every year.  Tetanus, diphtheria, and acellular pertussis (Tdap, Td) vaccine. You may need a Td booster every 10 years.  Varicella vaccine. You may need this if you have not been vaccinated.  Zoster vaccine. You may need this after age 5.  Measles, mumps, and rubella (MMR) vaccine. You may need at least one dose of MMR if you were born in  1957 or later. You may also need a second dose.  Pneumococcal 13-valent conjugate (PCV13) vaccine. You may need this if you have certain conditions and were not previously vaccinated.  Pneumococcal polysaccharide (PPSV23) vaccine. You may need one or two doses if you smoke cigarettes or if you have certain conditions.  Meningococcal vaccine. You may need this if you have certain  conditions.  Hepatitis A vaccine. You may need this if you have certain conditions or if you travel or work in places where you may be exposed to hepatitis A.  Hepatitis B vaccine. You may need this if you have certain conditions or if you travel or work in places where you may be exposed to hepatitis B.  Haemophilus influenzae type b (Hib) vaccine. You may need this if you have certain conditions.  Talk to your health care provider about which screenings and vaccines you need and how often you need them. This information is not intended to replace advice given to you by your health care provider. Make sure you discuss any questions you have with your health care provider. Document Released: 04/28/2015 Document Revised: 12/20/2015 Document Reviewed: 01/31/2015 Elsevier Interactive Patient Education  2017 [employer].

## 2016-10-03 LAB — CYTOLOGY - PAP
Diagnosis: NEGATIVE
HPV (WINDOPATH): NOT DETECTED

## 2016-10-04 ENCOUNTER — Ambulatory Visit
Admission: RE | Admit: 2016-10-04 | Discharge: 2016-10-04 | Disposition: A | Discharge status: Home / Self Care | Payer: BC Managed Care – PPO | Source: Ambulatory Visit | Attending: Obstetrics & Gynecology | Admitting: Obstetrics & Gynecology

## 2016-10-04 DIAGNOSIS — N912 Amenorrhea, unspecified: Secondary | ICD-10-CM | POA: Insufficient documentation

## 2016-10-04 DIAGNOSIS — D259 Leiomyoma of uterus, unspecified: Secondary | ICD-10-CM | POA: Insufficient documentation

## 2017-01-19 IMAGING — CT CT ABD-PELV W/ CM
1 of 3 series · 13 of 32 positions shown, 19 images · IV contrast (omnipaque)
Comparison: None.

CLINICAL DATA: Left lower quadrant pain since yesterday

EXAM:
CT ABDOMEN AND PELVIS WITH CONTRAST
TECHNIQUE: Multidetector CT imaging of the abdomen and pelvis was performed
using the standard protocol following bolus administration of
intravenous contrast.
CONTRAST:  100mL OMNIPAQUE IOHEXOL 350 MG/ML SOLN

[Series 3: routine abd pel with · axial · 0.67mm/px · z∈[-1011,-621]mm · 13 of 92 slices shown, 19 images]
[im 7/92  soft-tissue]
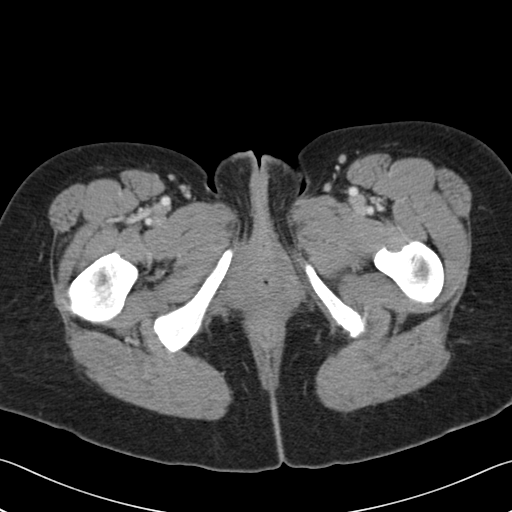
[im 7/92  bone]
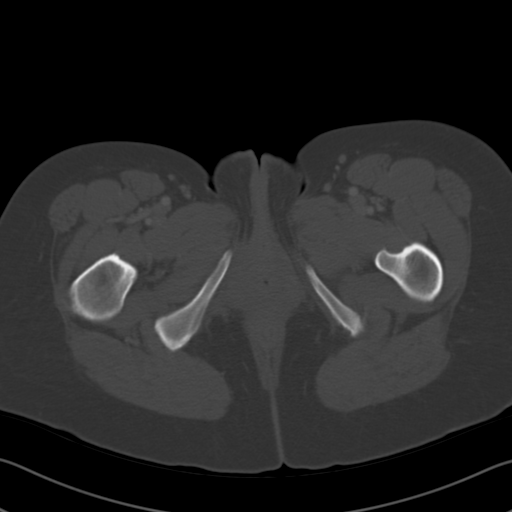
[im 14/92  soft-tissue]
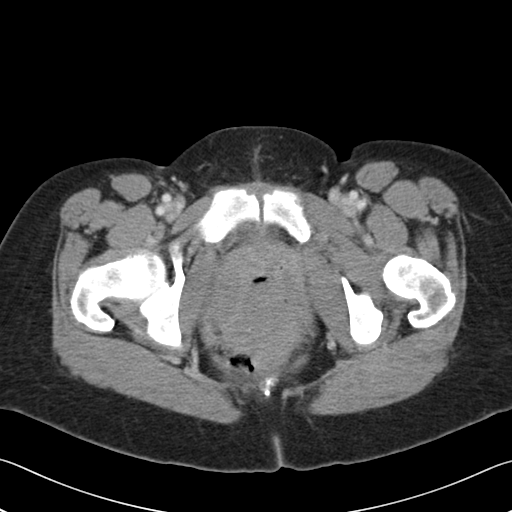
[im 20/92  soft-tissue]
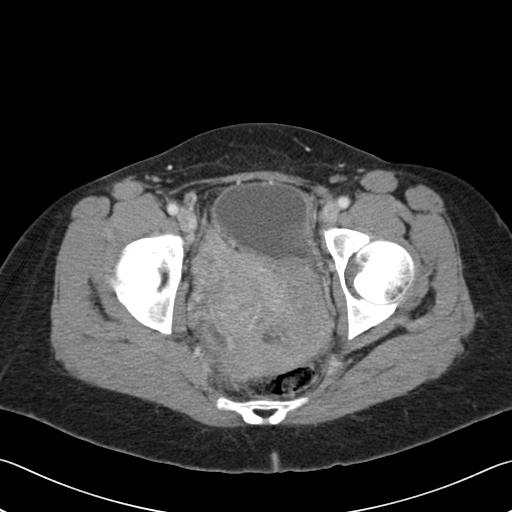
[im 27/92  soft-tissue]
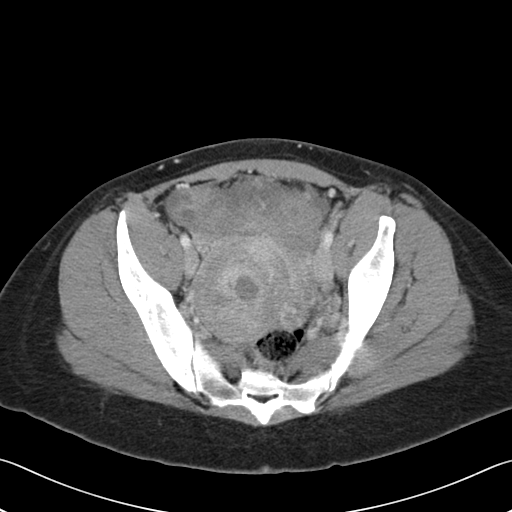
[im 33/92  soft-tissue]
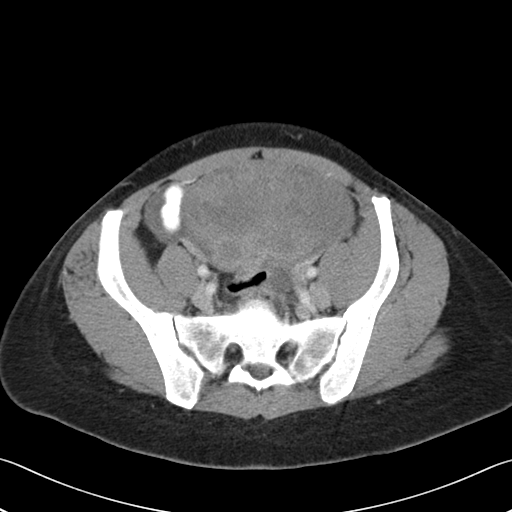
[im 40/92  soft-tissue]
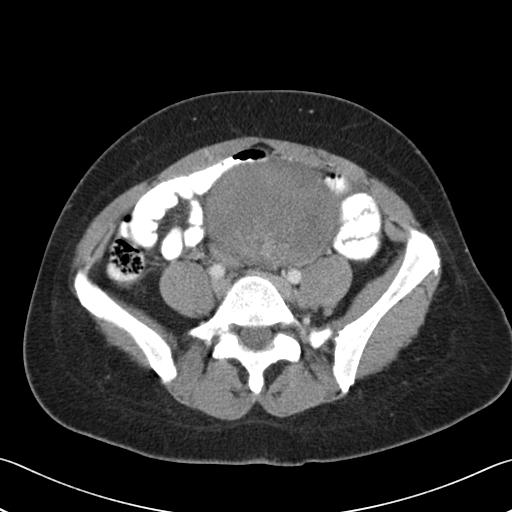
[im 46/92  soft-tissue]
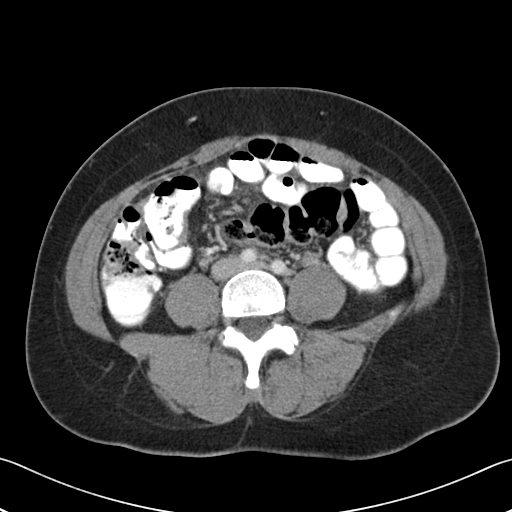
[im 53/92  soft-tissue]
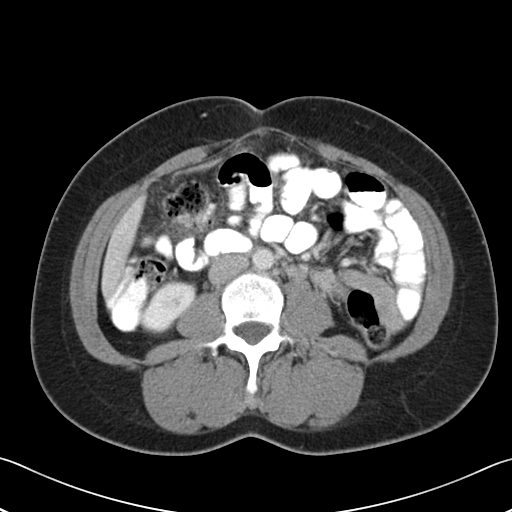
[im 59/92  soft-tissue]
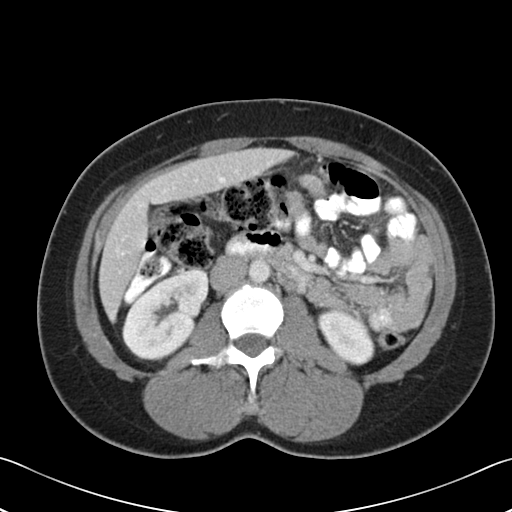
[im 59/92  bone]
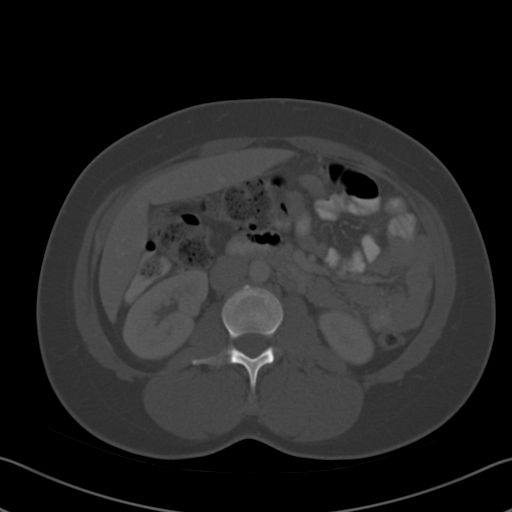
[im 66/92  soft-tissue]
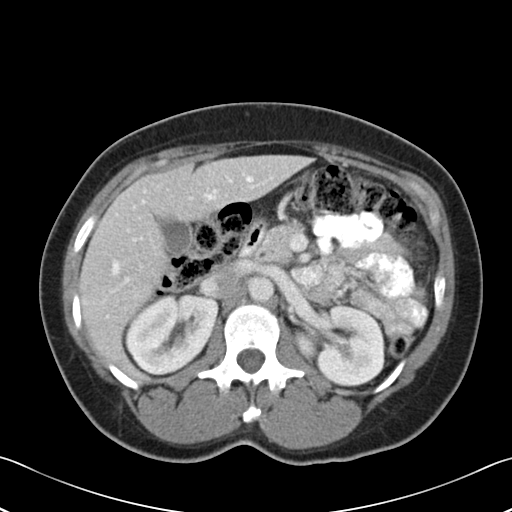
[im 66/92  lung]
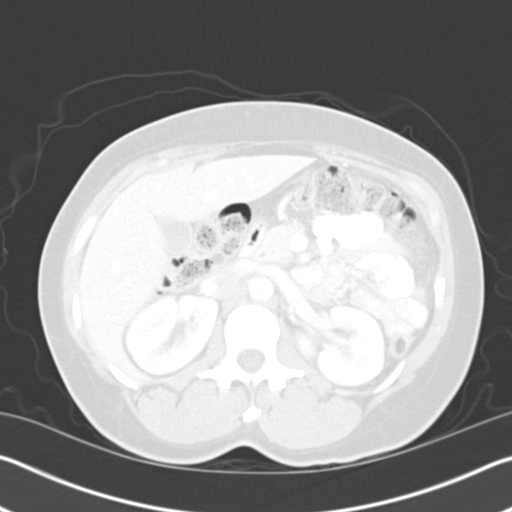
[im 72/92  soft-tissue]
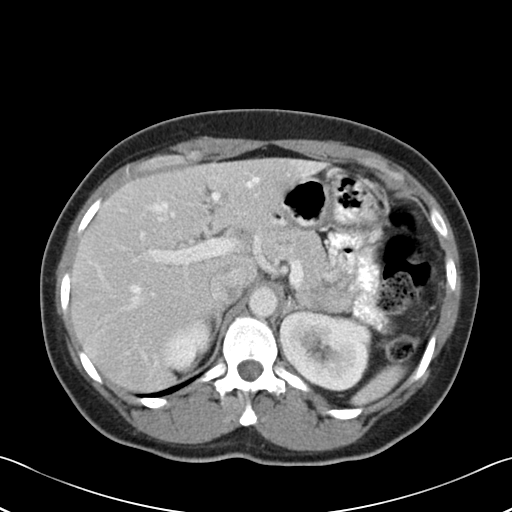
[im 72/92  lung]
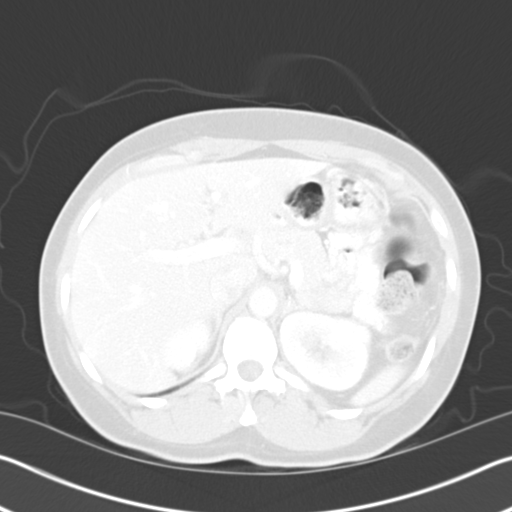
[im 79/92  soft-tissue]
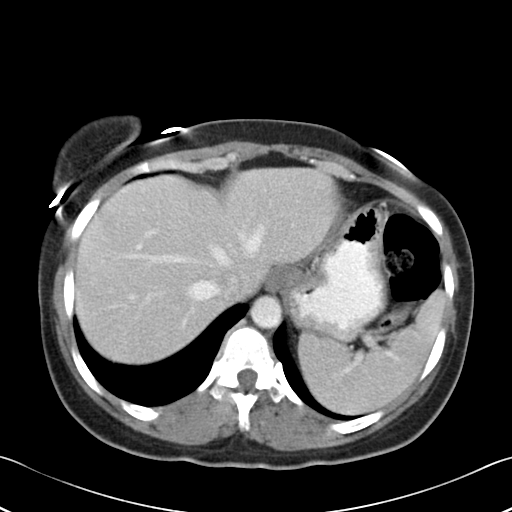
[im 79/92  lung]
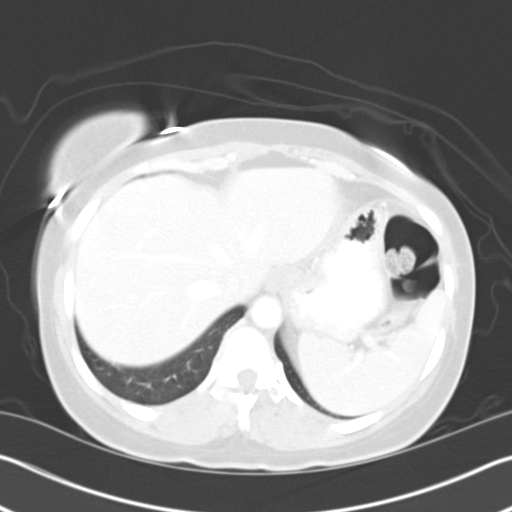
[im 85/92  soft-tissue]
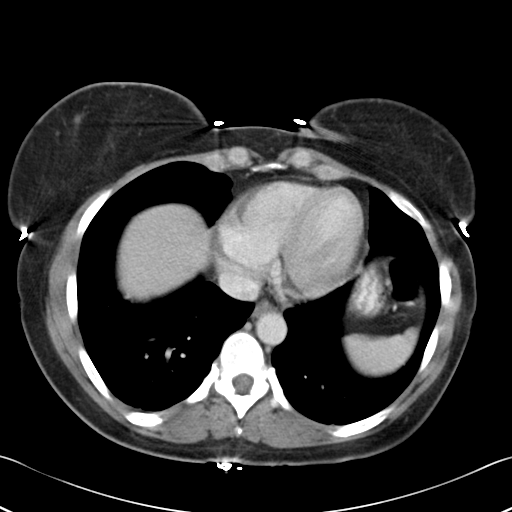
[im 85/92  lung]
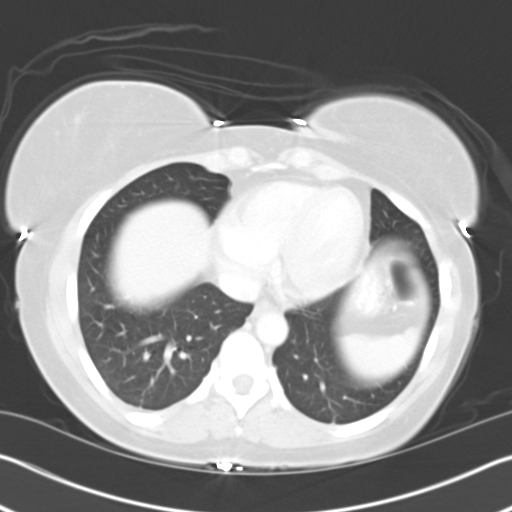

[13 of 32 positions shown; findings below may reference images not displayed]

FINDINGS: Lung bases are free of acute infiltrate or sizable effusion.

The liver, gallbladder, spleen, adrenal glands and pancreas are
within normal limits. The kidneys are well visualized and reveal no
renal calculi or urinary tract obstructive changes. Delayed images
demonstrate normal excretion of contrast material.

The appendix is within normal limits. The uterus is well visualized
and directly above the uterus there is a mixed attenuation lesion
which measures approximately 11.4 by 7.1 cm in greatest transverse
and AP dimensions Respectively. It measures approximately 9.6 cm in
craniocaudad projection. It appears to be exophytic from the fundus
of the uterus and would be consistent with a large uterine fibroid.
Previous plain film examination from 5525 demonstrates some
suggestion of increased density in retrospect. The uterus also
demonstrates prominent endometrial canal with fluid within. This
measures at least 2.1 cm in greatest dimension. Some nabothian cysts
are also noted. A left ovarian cyst is seen which measures 3.8 cm in
greatest dimension.

A small rounded area of fluid is noted on image number 58 of series
5 which may represent a right ovarian cyst. It measures 2 cm in
greatest dimension. No acute bony abnormality is seen.
IMPRESSION: Changes suggestive of large exophytic uterine fibroid from the
uterine fundus as described. Prominence of the endometrial canal is
also noted. Ultrasound of the pelvis may be helpful for
clarification.

Bilateral ovarian cystic change.

No other acute abnormality is noted.

## 2017-02-04 ENCOUNTER — Other Ambulatory Visit: Payer: Self-pay | Admitting: *Deleted

## 2017-02-05 MED ORDER — FERROUS SULFATE 325 (65 FE) MG PO TABS: ORAL_TABLET | ORAL | 8 refills | Status: DC

## 2017-03-04 ENCOUNTER — Ambulatory Visit: Payer: BC Managed Care – PPO | Admitting: Obstetrics & Gynecology

## 2017-03-04 ENCOUNTER — Encounter: Payer: Self-pay | Admitting: Obstetrics & Gynecology

## 2017-03-04 VITALS — BP 106/74 | HR 74 | Ht 66.0 in | Wt 165.0 lb

## 2017-03-04 DIAGNOSIS — N83202 Unspecified ovarian cyst, left side: Secondary | ICD-10-CM | POA: Diagnosis not present

## 2017-03-04 DIAGNOSIS — Z131 Encounter for screening for diabetes mellitus: Secondary | ICD-10-CM

## 2017-03-04 DIAGNOSIS — D219 Benign neoplasm of connective and other soft tissue, unspecified: Secondary | ICD-10-CM

## 2017-03-04 NOTE — Progress Notes (Signed)
GYNECOLOGY OFFICE VISIT NOTE  History:  51 y.o. G1P1 here today for follow up on fibroids and left ovarian cyst seen. Having occasional increased pelvic pressure. She denies any abnormal vaginal discharge, bleeding or other concerns.   Past Medical History:  Diagnosis Date  . Anemia    on iron pills  . Fibroids 06/29/2015   06/16/2015 Pelvic Ultrasound Uterus Measurements: 11.1 x 5.5 x 8.6 cm. Large exophytic fibroid from the uterine fundus measuring 11.5 x 7.8 x 9.5 cm heterogeneous echogenicity area noted along the right margin of the mid uterine segment chills likely an additional fibroid. It measures 3.5 x 2.5 x 2.9 cm. The cervix is unremarkable.   Marland Kitchen Herpes   . Vaginal discharge     Past Surgical History:  Procedure Laterality Date  . DILATATION & CURETTAGE/HYSTEROSCOPY WITH HYDROTHERMAL ABLATION N/A 07/09/2011   Performed by Osborne Oman, MD at [facility] ORS  . HYSTEROSCOPY W/ ENDOMETRIAL ABLATION  07/09/2011   HTA  . Right Wrist Surgery    . WISDOM TOOTH EXTRACTION      The following portions of the patient's history were reviewed and updated as appropriate: allergies, current medications, past family history, past medical history, past social history, past surgical history and problem list.   Health Maintenance:  Normal pap and negative HRHPV on 10/01/2016.  Normal mammogram on 09/30/2016.   Review of Systems:  Pertinent items noted in HPI and remainder of comprehensive ROS otherwise negative.  Objective:  Physical Exam BP 106/74   Pulse 74   Ht 5\' 6"  (1.676 m)   Wt 165 lb (74.8 kg)   LMP 02/11/2017   BMI 26.63 kg/m  CONSTITUTIONAL: Well-developed, well-nourished female in no acute distress.  HENT:  Normocephalic, atraumatic. External right and left ear normal. Oropharynx is clear and moist EYES: Conjunctivae and EOM are normal. Pupils are equal, round, and reactive to light. No scleral icterus.  NECK: Normal range of motion, supple, no masses SKIN: Skin is warm and  dry. No rash noted. Not diaphoretic. No erythema. No pallor. NEUROLOGIC: Alert and oriented to person, place, and time. Normal reflexes, muscle tone coordination. No cranial nerve deficit noted. PSYCHIATRIC: Normal mood and affect. Normal behavior. Normal judgment and thought content. CARDIOVASCULAR: Normal heart rate noted RESPIRATORY: Effort and breath sounds normal, no problems with respiration noted ABDOMEN: Soft, no distention noted.  Enlarged fibroid uterus palpated, no uterine or adnexal tenderness. PELVIC: Deferred MUSCULOSKELETAL: Normal range of motion. No edema noted.  Labs and Imaging 10/04/2016  TRANSABDOMINAL AND TRANSVAGINAL ULTRASOUND OF PELVIS CLINICAL DATA:  Fibroids, follow-up, prior endometrial ablation in 2012, weight loss COMPARISON:  CT abdomen and pelvis 09/27/2015 FINDINGS: UterusMeasurements: 10.7 x 5.2 x 7.1 cm. Diffusely heterogeneous myometrium. Large exophytic mass at uterine fundus consistent with leiomyoma 9.5 x 6.9 x 7.2 cm. Additional small leiomyoma at posterior mid uterus on RIGHT 2.7 x 2.2 x 1.8 cm. Endometrium  Thickness: 4 mm thick.  Small amount of endometrial fluid. Right ovaryMeasurements: 4.1 x 4.2 x 4.2 cm. Normal morphology without mass Left ovary Measurements: 6.7 x 2.3 x 3.8 cm. Small cysts, largest 3.0 x 2.9 x 2.7 cm. Other findings No free pelvic fluid or additional adnexal masses. IMPRESSION: Heterogeneous myometrium with large exophytic leiomyoma at uterine fundus 9.5 x 6.9 x 7.2 cm in size as well as a small posterior mid uterine leiomyoma 2.7 x 2.2 x 1.8 cm.Small amount of nonspecific endometrial fluid.Remainder of exam shows no significant abnormalities.  Electronically Signed   By: Elta Guadeloupe  Thornton Papas M.D.   On: 10/04/2016 13:31   Assessment & Plan:  1. Fibroids 2. Cyst of left ovary Will do follow up scan.  No further intervention needed. Will follow up results.  Will discuss any indicated results pending results.  - US  PELVIC COMPLETE WITH TRANSVAGINAL; Future  3. Diabetes mellitus screening Desires this screening test, it was ordered - Hemoglobin A1c  Routine preventative health maintenance measures emphasized. Please refer to After Visit Summary for other counseling recommendations.   Return if symptoms worsen or fail to improve.   Total face-to-face time with patient: 15 minutes.  Over 50% of encounter was spent on counseling and coordination of care.   Verita Schneiders, MD, [place] Attending [place], [facility] for [employer], [place]

## 2017-03-04 NOTE — Patient Instructions (Signed)
Return to clinic for any scheduled appointments or for any gynecologic concerns as needed.   

## 2017-03-05 LAB — HEMOGLOBIN A1C
Est. average glucose Bld gHb Est-mCnc: 111 mg/dL
Hgb A1c MFr Bld: 5.5 % (ref 4.8–5.6)

## 2017-03-17 ENCOUNTER — Ambulatory Visit
Admission: RE | Admit: 2017-03-17 | Discharge: 2017-03-17 | Disposition: A | Discharge status: Home / Self Care | Payer: BC Managed Care – PPO | Source: Ambulatory Visit | Attending: Obstetrics & Gynecology | Admitting: Obstetrics & Gynecology

## 2017-03-17 DIAGNOSIS — D219 Benign neoplasm of connective and other soft tissue, unspecified: Secondary | ICD-10-CM

## 2017-03-17 DIAGNOSIS — D259 Leiomyoma of uterus, unspecified: Secondary | ICD-10-CM | POA: Insufficient documentation

## 2017-03-17 DIAGNOSIS — N83202 Unspecified ovarian cyst, left side: Secondary | ICD-10-CM | POA: Diagnosis not present

## 2017-07-16 ENCOUNTER — Encounter: Payer: Self-pay | Admitting: Radiology

## 2017-10-06 ENCOUNTER — Ambulatory Visit: Payer: BC Managed Care – PPO | Admitting: Obstetrics & Gynecology

## 2017-10-10 ENCOUNTER — Ambulatory Visit: Payer: BC Managed Care – PPO | Admitting: Obstetrics & Gynecology

## 2017-10-13 NOTE — Progress Notes (Signed)
GYNECOLOGY ANNUAL PREVENTATIVE CARE ENCOUNTER NOTE  Subjective:   [name] is a 52 y.o. G1P1 female here for a routine annual gynecologic exam.  Current complaints: no new symptoms, occasional cramping episodes with her fibroid uterus but no significant issues.   Denies abnormal vaginal bleeding, discharge, pelvic pain, problems with intercourse or other gynecologic concerns.    Gynecologic History No LMP recorded. Patient has had an ablation. Contraception: none Last Pap: 10/01/2016. Results were: normal with negative HPV Last mammogram: 09/30/2016. Results were: normal  Obstetric History OB History  Gravida Para Term Preterm AB Living  1 1          SAB TAB Ectopic Multiple Live Births               # Outcome Date GA Lbr Len/2nd Weight Sex Delivery Anes PTL Lv  1 Para             Past Medical History:  Diagnosis Date  . Anemia    on iron pills  . Fibroids 06/29/2015   06/16/2015 Pelvic Ultrasound Uterus Measurements: 11.1 x 5.5 x 8.6 cm. Large exophytic fibroid from the uterine fundus measuring 11.5 x 7.8 x 9.5 cm heterogeneous echogenicity area noted along the right margin of the mid uterine segment chills likely an additional fibroid. It measures 3.5 x 2.5 x 2.9 cm. The cervix is unremarkable.   Marland Kitchen Herpes   . Vaginal discharge     Past Surgical History:  Procedure Laterality Date  . HYSTEROSCOPY W/ ENDOMETRIAL ABLATION  07/09/2011   HTA  . Right Wrist Surgery    . WISDOM TOOTH EXTRACTION      Current Outpatient Medications on File Prior to Visit  Medication Sig Dispense Refill  . etodolac (LODINE) 500 MG tablet Take 500 mg by mouth.    . ferrous sulfate 325 (65 FE) MG tablet TAKE 1 TABLET (325 MG TOTAL) BY MOUTH 2 (TWO) TIMES DAILY WITH A MEAL. 60 tablet 8  . folic acid (FOLVITE) 1 MG tablet Take 1 mg by mouth daily.    . methotrexate (RHEUMATREX) 2.5 MG tablet Take 2.5 mg by mouth once a week. Reported on 07/21/2015    . triamcinolone cream (KENALOG) 0.1 %  Apply topically. Reported on 09/20/2015    . valACYclovir (VALTREX) 500 MG tablet Take 1 tablet (500 mg total) by mouth daily as needed. For outbreaks (Patient not taking: Reported on 10/14/2017) 30 tablet 3  . [DISCONTINUED] medroxyPROGESTERone (PROVERA) 10 MG tablet Take 2 tablets (20 mg total) by mouth daily. 30 tablet 2   No current facility-administered medications on file prior to visit.     Allergies  Allergen Reactions  . Latex Hives    Social History:  reports that she has never smoked. She has never used smokeless tobacco. She reports that she does not drink alcohol or use drugs.  Family History  Problem Relation Age of Onset  . Cancer Mother 50       ovarian  . Breast cancer Mother   . Cancer Paternal Grandmother        BREAST  . Breast cancer Maternal Grandmother     The following portions of the patient's history were reviewed and updated as appropriate: allergies, current medications, past family history, past medical history, past social history, past surgical history and problem list.  Review of Systems Pertinent items noted in HPI and remainder of comprehensive ROS otherwise negative.   Objective:  BP 102/64   Pulse 72  Wt 165 lb 6.4 oz (75 kg)   BMI 26.70 kg/m  CONSTITUTIONAL: Well-developed, well-nourished female in no acute distress.  HENT:  Normocephalic, atraumatic, External right and left ear normal. Oropharynx is clear and moist EYES: Conjunctivae and EOM are normal. Pupils are equal, round, and reactive to light. No scleral icterus.  NECK: Normal range of motion, supple, no masses.  Normal thyroid.  SKIN: Skin is warm and dry. No rash noted. Not diaphoretic. No erythema. No pallor. MUSCULOSKELETAL: Normal range of motion. No tenderness.  No cyanosis, clubbing, or edema.  2+ distal pulses. NEUROLOGIC: Alert and oriented to person, place, and time. Normal reflexes, muscle tone coordination. No cranial nerve deficit noted. PSYCHIATRIC: Normal mood and  affect. Normal behavior. Normal judgment and thought content. CARDIOVASCULAR: Normal heart rate noted, regular rhythm RESPIRATORY: Clear to auscultation bilaterally. Effort and breath sounds normal, no problems with respiration noted. BREASTS: Symmetric in size. No masses, skin changes, nipple drainage, or lymphadenopathy. ABDOMEN: Soft, normal bowel sounds, no distention noted.  No tenderness, rebound or guarding.  PELVIC: Normal appearing external genitalia; normal appearing vaginal mucosa and cervix.  No abnormal discharge noted.  Pap smear obtained. Enlarged fibroid uterus palpated, no uterine or adnexal tenderness.    Assessment and Plan:  1. Breast cancer screening by mammogram - MM Digital Screening; Future Mammogram scheduled  2. Encounter for gynecological examination with Papanicolaou smear of cervix - Cytology - PAP Will follow up results of pap smear and manage accordingly. Routine preventative health maintenance measures emphasized. Please refer to After Visit Summary for other counseling recommendations.    Verita Schneiders, MD, [place] for [employer], [place]

## 2017-10-14 ENCOUNTER — Ambulatory Visit (INDEPENDENT_AMBULATORY_CARE_PROVIDER_SITE_OTHER): Payer: BC Managed Care – PPO | Admitting: Obstetrics & Gynecology

## 2017-10-14 ENCOUNTER — Other Ambulatory Visit (HOSPITAL_COMMUNITY)
Admission: RE | Admit: 2017-10-14 | Discharge: 2017-10-14 | Disposition: A | Discharge status: Home / Self Care | Payer: BC Managed Care – PPO | Source: Ambulatory Visit | Attending: Obstetrics & Gynecology | Admitting: Obstetrics & Gynecology

## 2017-10-14 ENCOUNTER — Encounter: Payer: Self-pay | Admitting: Obstetrics & Gynecology

## 2017-10-14 VITALS — BP 102/64 | HR 72 | Wt 165.4 lb

## 2017-10-14 DIAGNOSIS — Z01419 Encounter for gynecological examination (general) (routine) without abnormal findings: Secondary | ICD-10-CM | POA: Diagnosis not present

## 2017-10-14 DIAGNOSIS — Z1231 Encounter for screening mammogram for malignant neoplasm of breast: Secondary | ICD-10-CM

## 2017-10-14 NOTE — Patient Instructions (Signed)
Preventive Care 40-64 Years, Female Preventive care refers to lifestyle choices and visits with your health care provider that can promote health and wellness. What does preventive care include?  A yearly physical exam. This is also called an annual well check.  Dental exams once or twice a year.  Routine eye exams. Ask your health care provider how often you should have your eyes checked.  Personal lifestyle choices, including: ? Daily care of your teeth and gums. ? Regular physical activity. ? Eating a healthy diet. ? Avoiding tobacco and drug use. ? Limiting alcohol use. ? Practicing safe sex. ? Taking low-dose aspirin daily starting at age 58. ? Taking vitamin and mineral supplements as recommended by your health care provider. What happens during an annual well check? The services and screenings done by your health care provider during your annual well check will depend on your age, overall health, lifestyle risk factors, and family history of disease. Counseling Your health care provider may ask you questions about your:  Alcohol use.  Tobacco use.  Drug use.  Emotional well-being.  Home and relationship well-being.  Sexual activity.  Eating habits.  Work and work Statistician.  Method of birth control.  Menstrual cycle.  Pregnancy history.  Screening You may have the following tests or measurements:  Height, weight, and BMI.  Blood pressure.  Lipid and cholesterol levels. These may be checked every 5 years, or more frequently if you are over 81 years old.  Skin check.  Lung cancer screening. You may have this screening every year starting at age 78 if you have a 30-pack-year history of smoking and currently smoke or have quit within the past 15 years.  Fecal occult blood test (FOBT) of the stool. You may have this test every year starting at age 65.  Flexible sigmoidoscopy or colonoscopy. You may have a sigmoidoscopy every 5 years or a colonoscopy  every 10 years starting at age 30.  Hepatitis C blood test.  Hepatitis B blood test.  Sexually transmitted disease (STD) testing.  Diabetes screening. This is done by checking your blood sugar (glucose) after you have not eaten for a while (fasting). You may have this done every 1-3 years.  Mammogram. This may be done every 1-2 years. Talk to your health care provider about when you should start having regular mammograms. This may depend on whether you have a family history of breast cancer.  BRCA-related cancer screening. This may be done if you have a family history of breast, ovarian, tubal, or peritoneal cancers.  Pelvic exam and Pap test. This may be done every 3 years starting at age 80. Starting at age 36, this may be done every 5 years if you have a Pap test in combination with an HPV test.  Bone density scan. This is done to screen for osteoporosis. You may have this scan if you are at high risk for osteoporosis.  Discuss your test results, treatment options, and if necessary, the need for more tests with your health care provider. Vaccines Your health care provider may recommend certain vaccines, such as:  Influenza vaccine. This is recommended every year.  Tetanus, diphtheria, and acellular pertussis (Tdap, Td) vaccine. You may need a Td booster every 10 years.  Varicella vaccine. You may need this if you have not been vaccinated.  Zoster vaccine. You may need this after age 5.  Measles, mumps, and rubella (MMR) vaccine. You may need at least one dose of MMR if you were born in  1957 or later. You may also need a second dose.  Pneumococcal 13-valent conjugate (PCV13) vaccine. You may need this if you have certain conditions and were not previously vaccinated.  Pneumococcal polysaccharide (PPSV23) vaccine. You may need one or two doses if you smoke cigarettes or if you have certain conditions.  Meningococcal vaccine. You may need this if you have certain  conditions.  Hepatitis A vaccine. You may need this if you have certain conditions or if you travel or work in places where you may be exposed to hepatitis A.  Hepatitis B vaccine. You may need this if you have certain conditions or if you travel or work in places where you may be exposed to hepatitis B.  Haemophilus influenzae type b (Hib) vaccine. You may need this if you have certain conditions.  Talk to your health care provider about which screenings and vaccines you need and how often you need them. This information is not intended to replace advice given to you by your health care provider. Make sure you discuss any questions you have with your health care provider. Document Released: 04/28/2015 Document Revised: 12/20/2015 Document Reviewed: 01/31/2015 Elsevier Interactive Patient Education  2018 Elsevier Inc.  

## 2017-10-15 LAB — CYTOLOGY - PAP
DIAGNOSIS: NEGATIVE
HPV: NOT DETECTED

## 2017-11-06 ENCOUNTER — Ambulatory Visit
Admission: RE | Admit: 2017-11-06 | Discharge: 2017-11-06 | Disposition: A | Discharge status: Home / Self Care | Payer: BC Managed Care – PPO | Source: Ambulatory Visit | Attending: Obstetrics & Gynecology | Admitting: Obstetrics & Gynecology

## 2017-11-06 DIAGNOSIS — Z1231 Encounter for screening mammogram for malignant neoplasm of breast: Secondary | ICD-10-CM

## 2017-12-17 ENCOUNTER — Other Ambulatory Visit: Payer: Self-pay | Admitting: Obstetrics & Gynecology

## 2018-01-13 ENCOUNTER — Encounter: Payer: Self-pay | Admitting: Obstetrics & Gynecology

## 2018-01-13 ENCOUNTER — Ambulatory Visit (INDEPENDENT_AMBULATORY_CARE_PROVIDER_SITE_OTHER): Payer: BC Managed Care – PPO | Admitting: Obstetrics & Gynecology

## 2018-01-13 VITALS — BP 107/70 | HR 98 | Wt 164.2 lb

## 2018-01-13 DIAGNOSIS — N951 Menopausal and female climacteric states: Secondary | ICD-10-CM | POA: Diagnosis not present

## 2018-01-13 DIAGNOSIS — N941 Unspecified dyspareunia: Secondary | ICD-10-CM | POA: Diagnosis not present

## 2018-01-13 MED ORDER — OSPEMIFENE 60 MG PO TABS: 1.0000 | ORAL_TABLET | Freq: Every day | ORAL | 3 refills | Status: AC

## 2018-01-13 NOTE — Progress Notes (Signed)
   GYNECOLOGY OFFICE VISIT NOTE  History:  52 y.o. G1P1 here today with report of vaginal dryness and associated dyspareunia. Not helped much by water based lubrication. She is menopausal/perimenopausa (had an ablation) and also has occasional night sweats and hot flashes. She is concerned about the vulvovaginal dryness, had normal pelvic exam a couple of months ago. She denies any abnormal vaginal discharge, bleeding, pelvic pain or other concerns.   Past Medical History:  Diagnosis Date  . Anemia    on iron pills  . Fibroids 06/29/2015   06/16/2015 Pelvic Ultrasound Uterus Measurements: 11.1 x 5.5 x 8.6 cm. Large exophytic fibroid from the uterine fundus measuring 11.5 x 7.8 x 9.5 cm heterogeneous echogenicity area noted along the right margin of the mid uterine segment chills likely an additional fibroid. It measures 3.5 x 2.5 x 2.9 cm. The cervix is unremarkable.   Marland Kitchen Herpes   . Vaginal discharge     Past Surgical History:  Procedure Laterality Date  . HYSTEROSCOPY W/ ENDOMETRIAL ABLATION  07/09/2011   HTA  . Right Wrist Surgery    . WISDOM TOOTH EXTRACTION      The following portions of the patient's history were reviewed and updated as appropriate: allergies, current medications, past family history, past medical history, past social history, past surgical history and problem list.   Health Maintenance:  Normal pap and negative HRHPV on 10/14/2017.  Normal mammogram on 11/06/2017.   Review of Systems:  Pertinent items noted in HPI and remainder of comprehensive ROS otherwise negative.  Objective:  Physical Exam BP 107/70   Pulse 98   Wt 164 lb 3.2 oz (74.5 kg)   BMI 26.50 kg/m  CONSTITUTIONAL: Well-developed, well-nourished female in no acute distress.  MUSCULOSKELETAL: Normal range of motion. No edema noted. NEUROLOGIC: Alert and oriented to person, place, and time. Normal muscle tone coordination. No cranial nerve deficit noted. PSYCHIATRIC: Normal mood and affect. Normal  behavior. Normal judgment and thought content. CARDIOVASCULAR: Normal heart rate noted RESPIRATORY: Effort and breath sounds normal, no problems with respiration noted ABDOMEN: Soft, no distention noted.   PELVIC: Deferred for now   Assessment & Plan:  1. Dyspareunia, female 2. Menopausal vaginal dryness Discussed use of silicone based hypoallergenic lubrications to help with symptoms.  Also discussed using vaginal estrogen therapy, or the alternatives of Osphena or Intrarosa. Details given for each modality, all questions answered. She does not want to use any vaginal preparations for now, will try the silicone lubrication as needed and wants to [place]. Samples for 15 tablets given, she will return for more if she does well with this without concerning side effects.   - Ospemifene (OSPHENA) 60 MG TABS; Take 1 tablet by mouth daily at 12 noon for 15 doses.  Dispense: 15 tablet; Refill: 3 Routine preventative health maintenance measures emphasized. Please refer to After Visit Summary for other counseling recommendations.   Return in about 1 month (around 02/13/2018).   Total face-to-face time with patient: 15 minutes.  Over 50% of encounter was spent on counseling and coordination of care.   Verita Schneiders, MD, [place] for [facility], [place]

## 2018-01-13 NOTE — Patient Instructions (Signed)
Hypoallergenic silicone lubrication

## 2018-02-12 ENCOUNTER — Ambulatory Visit (INDEPENDENT_AMBULATORY_CARE_PROVIDER_SITE_OTHER): Payer: BC Managed Care – PPO | Admitting: Obstetrics & Gynecology

## 2018-02-12 ENCOUNTER — Encounter: Payer: Self-pay | Admitting: Obstetrics & Gynecology

## 2018-02-12 VITALS — BP 97/65 | HR 79 | Wt 167.0 lb

## 2018-02-12 DIAGNOSIS — N951 Menopausal and female climacteric states: Secondary | ICD-10-CM | POA: Diagnosis not present

## 2018-02-12 DIAGNOSIS — N941 Unspecified dyspareunia: Secondary | ICD-10-CM | POA: Diagnosis not present

## 2018-02-12 DIAGNOSIS — D219 Benign neoplasm of connective and other soft tissue, unspecified: Secondary | ICD-10-CM | POA: Diagnosis not present

## 2018-02-12 MED ORDER — OSPEMIFENE 60 MG PO TABS: 1.0000 | ORAL_TABLET | Freq: Every day | ORAL | 5 refills | Status: DC

## 2018-02-12 NOTE — Patient Instructions (Signed)
Return to clinic for any scheduled appointments or for any gynecologic concerns as needed.   

## 2018-02-12 NOTE — Progress Notes (Signed)
   GYNECOLOGY OFFICE VISIT NOTE  History:  52 y.o. G1P1 here today for follow up for vaginal dryness and dyspareunia. Was prescribed Osphena last visit, told to use silicone hypoallergenic lubrication. She is very satisfied with Osphena, wants prescription and more samples. Has not used lubrication yet.  She denies any abnormal vaginal discharge, bleeding, pelvic pain or other concerns. Of note, patient wants surveillance ultrasound for her fibroids, last one was in 03/2017. No acute symptoms, just continued pressure.   Past Medical History:  Diagnosis Date  . Anemia    on iron pills  . Fibroids 06/29/2015   03/17/2017 Pelvic Ultrasound Uterus Measurements: 9.5 x 5.5 x 7.2 cm (volume = 200 cm^3). Large subserosal exophytic fibroid arising from the uterine fundus measures 9.6 x 7.0 x 8.8 cm. Right posterior intramural fibroid measures 2.3 x 2.3 x 2.6 cm.  Marland Kitchen Herpes   . Vaginal discharge     Past Surgical History:  Procedure Laterality Date  . HYSTEROSCOPY W/ ENDOMETRIAL ABLATION  07/09/2011   HTA  . Right Wrist Surgery    . WISDOM TOOTH EXTRACTION      The following portions of the patient's history were reviewed and updated as appropriate: allergies, current medications, past family history, past medical history, past social history, past surgical history and problem list.   Health Maintenance:  Normal pap and negative HRHPV on 10/14/2017.  Normal mammogram on 11/06/2017.   Review of Systems:  Pertinent items noted in HPI and remainder of comprehensive ROS otherwise negative.  Objective:  Physical Exam BP 97/65   Pulse 79   Wt 167 lb (75.8 kg)   BMI 26.95 kg/m  CONSTITUTIONAL: Well-developed, well-nourished female in no acute distress.  HEENT:  Normocephalic, atraumatic. External right and left ear normal. No scleral icterus.  NECK: Normal range of motion, supple, no masses noted on observation MUSCULOSKELETAL: Normal range of motion. No edema noted. NEUROLOGIC: Alert and oriented  to person, place, and time. Normal muscle tone coordination. No cranial nerve deficit noted. PSYCHIATRIC: Normal mood and affect. Normal behavior. Normal judgment and thought content. CARDIOVASCULAR: Normal heart rate noted RESPIRATORY: Effort and breath sounds normal, no problems with respiration noted ABDOMEN: No overt distention noted.   PELVIC: Deferred   Assessment & Plan:  1. Dyspareunia, female 2. Menopausal vaginal dryness 15 tablet sample given, also prescribed Osphena.  Advised to use silicone lubricant as instructed as needed (symptoms not previously helped with water based lubricants). Will monitor response. - Ospemifene 60 MG TABS; Take 1 tablet by mouth daily.  Dispense: 90 tablet; Refill: 5  3. Fibroids Ultrasound ordered, will follow up results and manage accordingly. - US PELVIC COMPLETE WITH TRANSVAGINAL; Future  Routine preventative health maintenance measures emphasized. Please refer to After Visit Summary for other counseling recommendations.   Return if symptoms worsen or fail to improve.  Total face-to-face time with patient: 15 minutes.  Over 50% of encounter was spent on counseling and coordination of care.   Verita Schneiders, MD, [place] for [facility], [place]

## 2018-02-24 ENCOUNTER — Telehealth: Payer: Self-pay | Admitting: *Deleted

## 2018-02-24 NOTE — Telephone Encounter (Signed)
Left message for patient in regards to her RX, we can give her more samples until she can get RX filled. Will eave her samples and a pharmacy savings card too.   Crosby Oyster RN

## 2018-02-24 NOTE — Telephone Encounter (Signed)
-----   Message from Osborne Oman, MD sent at 02/24/2018  2:56 PM EST ----- Regarding: RE: please call patient about rx Contact: [phone] Did she try the Osphena patient's savings card? That could help...  Or GoodRx?  Can we given her more samples until it is figured out? ----- Message ----- From: Nelta Numbers, RN Sent: 02/24/2018   2:34 PM EST To: Osborne Oman, MD Subject: FW: please call patient about rx                ----- Message ----- From: Blanchie Dessert, NT Sent: 02/24/2018  11:38 AM EST To: [place] Clinical Pool Subject: please call patient about rx                   Insurance will not cover would like to know alternative

## 2018-03-23 ENCOUNTER — Ambulatory Visit: Payer: BC Managed Care – PPO

## 2018-04-10 ENCOUNTER — Ambulatory Visit
Admission: RE | Admit: 2018-04-10 | Discharge: 2018-04-10 | Disposition: A | Discharge status: Home / Self Care | Payer: BC Managed Care – PPO | Source: Ambulatory Visit | Attending: Obstetrics & Gynecology | Admitting: Obstetrics & Gynecology

## 2018-04-10 DIAGNOSIS — D259 Leiomyoma of uterus, unspecified: Secondary | ICD-10-CM | POA: Diagnosis not present

## 2018-04-10 DIAGNOSIS — N83202 Unspecified ovarian cyst, left side: Secondary | ICD-10-CM | POA: Insufficient documentation

## 2018-04-10 DIAGNOSIS — D219 Benign neoplasm of connective and other soft tissue, unspecified: Secondary | ICD-10-CM | POA: Diagnosis present

## 2018-05-07 ENCOUNTER — Encounter: Payer: Self-pay | Admitting: Obstetrics & Gynecology

## 2018-05-07 ENCOUNTER — Ambulatory Visit (INDEPENDENT_AMBULATORY_CARE_PROVIDER_SITE_OTHER): Payer: BC Managed Care – PPO | Admitting: Obstetrics & Gynecology

## 2018-05-07 VITALS — BP 110/72 | HR 72 | Wt 167.4 lb

## 2018-05-07 DIAGNOSIS — N939 Abnormal uterine and vaginal bleeding, unspecified: Secondary | ICD-10-CM

## 2018-05-07 DIAGNOSIS — D219 Benign neoplasm of connective and other soft tissue, unspecified: Secondary | ICD-10-CM

## 2018-05-07 NOTE — Progress Notes (Signed)
GYNECOLOGY OFFICE VISIT NOTE  History:  53 y.o. G1P1 here today for follow up. Reports one episode of light bleeding that lasted for 2-3 days; this occurred last month. She is concerned about the bleeding episode, wants to make sure all was well.  No bleeding since endometrial ablation in 2013.  Also has resolving bump on her vulva, wanted evaluation of this.  She denies any current abnormal vaginal discharge, bleeding, pelvic pain from fibroids or other concerns.   Past Medical History:  Diagnosis Date  . Anemia    on iron pills  . Fibroids 06/29/2015  . Herpes     Past Surgical History:  Procedure Laterality Date  . HYSTEROSCOPY W/ ENDOMETRIAL ABLATION  07/09/2011   HTA  . Right Wrist Surgery    . WISDOM TOOTH EXTRACTION      The following portions of the patient's history were reviewed and updated as appropriate: allergies, current medications, past family history, past medical history, past social history, past surgical history and problem list.   Health Maintenance:  Normal pap and negative HRHPV on 10/14/2017.  Normal mammogram on 11/06/2017.   Review of Systems:  Pertinent items noted in HPI and remainder of comprehensive ROS otherwise negative.  Objective:  Physical Exam BP 110/72   Pulse 72   Wt 167 lb 6.4 oz (75.9 kg)   LMP  (LMP Unknown)   BMI 27.02 kg/m  CONSTITUTIONAL: Well-developed, well-nourished female in no acute distress.  HEENT:  Normocephalic, atraumatic. External right and left ear normal. No scleral icterus.  NECK: Normal range of motion, supple, no masses noted on observation SKIN: No rash noted. Not diaphoretic. No erythema. No pallor. MUSCULOSKELETAL: Normal range of motion. No edema noted. NEUROLOGIC: Alert and oriented to person, place, and time. Normal muscle tone coordination. No cranial nerve deficit noted. PSYCHIATRIC: Normal mood and affect. Normal behavior. Normal judgment and thought content. CARDIOVASCULAR: Normal heart rate  noted RESPIRATORY: Effort and breath sounds normal, no problems with respiration noted ABDOMEN: No masses noted. No other overt distention noted.   PELVIC: Normal appearing external genitalia, resolving bump on lower aspect of right labia minus noted. No erythema, not concerning. Patient reassured. Normal appearing vaginal mucosa and cervix. No abnormal discharge noted.  Enlarged fibroid uterus palpated, no uterine or adnexal tenderness.  ENDOMETRIAL BIOPSY     The indications for endometrial biopsy were reviewed.   Risks of the biopsy including cramping, bleeding, infection, uterine perforation, inadequate specimen and need for additional procedures  were discussed. The patient states she understands and agrees to undergo procedure today. Consent was signed. Time out was performed.  During the pelvic exam, the cervix was prepped with Betadine. A single-toothed tenaculum was placed on the anterior lip of the cervix to stabilize it. The 3 mm pipelle was introduced into the endometrial cavity without difficulty to a depth of 7 cm, and a small amount of tissue was obtained after two passes and sent to pathology. The instruments were removed from the patient's vagina. Minimal bleeding from the cervix was noted. The patient tolerated the procedure well. Routine post-procedure instructions were given to the patient.    Imaging US Pelvic Complete With Transvaginal  Result Date: 04/10/2018 CLINICAL DATA:  Followup uterine fibroids EXAM: TRANSABDOMINAL AND TRANSVAGINAL ULTRASOUND OF PELVIS TECHNIQUE: Both transabdominal and transvaginal ultrasound examinations of the pelvis were performed. Transabdominal technique was performed for global imaging of the pelvis including uterus, ovaries, adnexal regions, and pelvic cul-de-sac. It was necessary to proceed with endovaginal exam  following the transabdominal exam to visualize the endometrium and ovaries. COMPARISON:  None FINDINGS: Uterus Measurements: 12 x 6.0 x  7.3 cm = volume: 274 mL. Previously the uterus had a volume of 200 cc. Again seen are multiple uterine fibroids: -right fundal subserosal fibroid measures 8.1 x 7.2 x 9.5 cm. Previously 9.6 x 7.0 x 8.8 cm. -right intramural, lower uterine segment fibroid measures 3.5 x 2.6 x 3.2 cm. Previously 2.3 x 2.3 x 2.6 cm. -right fundal subserosal fibroid measures 4.2 x 3.4 x 4.4 cm. -left sub mucosal fibroid measures 1.8 x 1.6 x 1.6 cm. Endometrium Thickness: 10 mm.  No focal abnormality visualized. Right ovary Measurements: 3.3 x 1.4 x 2.0 cm = volume: 4.9 ML. Normal appearance/no adnexal mass. Left ovary Measurements: 3.7 x 1.7 x 3.3 ml = volume: 10.9 mL. Left ovary cyst measures 2.6 x 2.6 x 2.3 cm. Other findings No abnormal free fluid. IMPRESSION: 1. Enlarged fibroid uterus. This is slightly increased in volume when compared with 03/17/2017. 2. Left ovary cyst measures 2.6 cm. Electronically Signed   By: Kerby Moors M.D.   On: 04/10/2018 09:03    Assessment & Plan:  Abnormal uterine bleeding (AUB) in the setting of known large fibroids Reviewed ultrasound findings.  Reassured patient that light bleeding can occur, especially 7 years after ablation.  However, given possible perimenopausal state or menopausal state (normal [place] in 2017, will be rechecked today with other AUB labs), biopsy done to evaluate for any endometrial pathology. Will follow up results and manage accordingly. As long as bleeding is not heavy, erratic, can observe for now but patient also offered definitive management with hysterectomy. She is considering this for now, will let us know.  - Surgical pathology - CBC - Follicle stimulating hormone - TSH Routine preventative health maintenance measures emphasized. Please refer to After Visit Summary for other counseling recommendations.   Return for any gynecologic concerns.   Total face-to-face time with patient: 25 minutes.  Over 50% of encounter was spent on counseling and  coordination of care.   Verita Schneiders, MD, [place] for [facility], [place]

## 2018-05-07 NOTE — Patient Instructions (Signed)

## 2018-05-08 LAB — CBC
HEMATOCRIT: 35.6 % (ref 34.0–46.6)
Hemoglobin: 11.8 g/dL (ref 11.1–15.9)
MCH: 30 pg (ref 26.6–33.0)
MCHC: 33.1 g/dL (ref 31.5–35.7)
MCV: 91 fL (ref 79–97)
Platelets: 202 10*3/uL (ref 150–450)
RBC: 3.93 x10E6/uL (ref 3.77–5.28)
RDW: 12.1 % (ref 11.7–15.4)
WBC: 3.6 10*3/uL (ref 3.4–10.8)

## 2018-05-08 LAB — FOLLICLE STIMULATING HORMONE: FSH: 59.8 m[IU]/mL

## 2018-05-08 LAB — TSH: TSH: 2.14 u[IU]/mL (ref 0.450–4.500)

## 2018-05-11 ENCOUNTER — Telehealth: Payer: Self-pay | Admitting: *Deleted

## 2018-05-11 NOTE — Telephone Encounter (Signed)
Called patient and informed her of results and follow up appointment needs to be made to discuss further management.  Crosby Oyster, RN

## 2018-05-11 NOTE — Telephone Encounter (Signed)
-----   Message from Osborne Oman, MD sent at 05/11/2018 12:27 PM EST ----- 05/07/2018 Endometrium, biopsy, uterus - UNREMARKABLE LOWER UTERINE SEGMENT-TYPE, ENDOCERVICAL GLANDULAR AND SQUAMOUS MUCOSA. - NO DISTINCT ENDOMETRIAL TISSUE PRESENT FOR EVALUATION. No endometrial tissue was seen, so this is considered an inadequate sampling.  Given menopausal state, need to evaluate this bleeding further.  An appointment needs to be made for patient to discuss these results and further management. Please call her and tell her the results and recommendations.  Verita Schneiders, MD, [place] for [facility], [place]

## 2018-05-14 ENCOUNTER — Ambulatory Visit (INDEPENDENT_AMBULATORY_CARE_PROVIDER_SITE_OTHER): Payer: BC Managed Care – PPO | Admitting: Obstetrics & Gynecology

## 2018-05-14 ENCOUNTER — Encounter: Payer: Self-pay | Admitting: Obstetrics & Gynecology

## 2018-05-14 VITALS — BP 120/76 | HR 73

## 2018-05-14 DIAGNOSIS — N950 Postmenopausal bleeding: Secondary | ICD-10-CM | POA: Insufficient documentation

## 2018-05-14 DIAGNOSIS — D219 Benign neoplasm of connective and other soft tissue, unspecified: Secondary | ICD-10-CM | POA: Diagnosis not present

## 2018-05-14 DIAGNOSIS — Z712 Person consulting for explanation of examination or test findings: Secondary | ICD-10-CM | POA: Diagnosis not present

## 2018-05-14 HISTORY — DX: Postmenopausal bleeding: N95.0

## 2018-05-14 NOTE — Progress Notes (Signed)
GYNECOLOGY OFFICE VISIT NOTE  History:  53 y.o. G1P1001 here today for follow up results after recent endometrial biopsy done for postmenopausal bleeding in the setting of know fibroids.  She denies any current abnormal vaginal discharge, bleeding, pelvic pain or other concerns.   Past Medical History:  Diagnosis Date  . Anemia    on iron pills  . Fibroids 06/29/2015  . Herpes     Past Surgical History:  Procedure Laterality Date  . HYSTEROSCOPY W/ ENDOMETRIAL ABLATION  07/09/2011   HTA  . Right Wrist Surgery    . WISDOM TOOTH EXTRACTION      The following portions of the patient's history were reviewed and updated as appropriate: allergies, current medications, past family history, past medical history, past social history, past surgical history and problem list.   Health Maintenance:   Normal pap and negative HRHPV on 10/14/2017.  Normal mammogram on 11/06/2017.  Review of Systems:  Pertinent items noted in HPI and remainder of comprehensive ROS otherwise negative.  Objective:  Physical Exam BP 120/76   Pulse 73   LMP  (LMP Unknown)  CONSTITUTIONAL: Well-developed, well-nourished female in no acute distress.  HEENT:  Normocephalic, atraumatic. External right and left ear normal. No scleral icterus.  NECK: Normal range of motion, supple, no masses noted on observation SKIN: No rash noted. Not diaphoretic. No erythema. No pallor. MUSCULOSKELETAL: Normal range of motion. No edema noted. NEUROLOGIC: Alert and oriented to person, place, and time. Normal muscle tone coordination. No cranial nerve deficit noted. PSYCHIATRIC: Normal mood and affect. Normal behavior. Normal judgment and thought content. CARDIOVASCULAR: Normal heart rate noted RESPIRATORY: Effort and breath sounds normal, no problems with respiration noted ABDOMEN: Enlarged fibroid uterus palpated. No other overt distention noted.   PELVIC: Deferred  Labs and Imaging Results for orders placed or performed in  visit on 05/07/18 (from the past 504 hour(s))  CBC   Collection Time: 05/07/18  9:03 AM  Result Value Ref Range   WBC 3.6 3.4 - 10.8 x10E3/uL   RBC 3.93 3.77 - 5.28 x10E6/uL   Hemoglobin 11.8 11.1 - 15.9 g/dL   Hematocrit 35.6 34.0 - 46.6 %   MCV 91 79 - 97 fL   MCH 30.0 26.6 - 33.0 pg   MCHC 33.1 31.5 - 35.7 g/dL   RDW 12.1 11.7 - 15.4 %   Platelets 202 150 - 941 D40C1/KG  Follicle stimulating hormone   Collection Time: 05/07/18  9:03 AM  Result Value Ref Range   FSH 59.8 mIU/mL  TSH   Collection Time: 05/07/18  9:03 AM  Result Value Ref Range   TSH 2.140 0.450 - 4.500 uIU/mL   05/07/2018 Endometrium, biopsy, uterus - UNREMARKABLE LOWER UTERINE SEGMENT-TYPE, ENDOCERVICAL GLANDULAR AND SQUAMOUS MUCOSA. - NO DISTINCT ENDOMETRIAL TISSUE PRESENT FOR EVALUATION.  Assessment & Plan:  1. Postmenopausal bleeding 2. Fibroids 3. Encounter to discuss test results No endometrial tissue was seen, so this is considered an inadequate sampling test for postmenopausal bleeding. She has no further bleeding for now. If recurs, recommended hysterectomy for definitive management given that repeat biopsy will not likely yield any different results (there was fibroid obstruction during the biopsy around 7 cm and scant tissue was obtained). She is considering this for now. Her uterus is globular, but she may be a RATH candidate, will send images for Dr. Ihor Dow to review.  No scheduling any surgery for now.  Bleeding precautions reviewed.  Return for any gynecologic concerns.  Total face-to-face time with  patient: 15 minutes.  Over 50% of encounter was spent on counseling and coordination of care.   Verita Schneiders, MD, [place] for [facility], [place]

## 2018-05-14 NOTE — Progress Notes (Deleted)
    GYNECOLOGY OFFICE VISIT NOTE  History:  53 y.o. G1P1001 here today for ***. She denies any abnormal vaginal discharge, bleeding, pelvic pain or other concerns.   Past Medical History:  Diagnosis Date  . Anemia    on iron pills  . Fibroids 06/29/2015  . Herpes     Past Surgical History:  Procedure Laterality Date  . HYSTEROSCOPY W/ ENDOMETRIAL ABLATION  07/09/2011   HTA  . Right Wrist Surgery    . WISDOM TOOTH EXTRACTION      The following portions of the patient's history were reviewed and updated as appropriate: allergies, current medications, past family history, past medical history, past social history, past surgical history and problem list.   Health Maintenance:  Normal pap and negative HRHPV on ***.  Normal mammogram on ***.   Review of Systems:  Pertinent items noted in HPI and remainder of comprehensive ROS otherwise negative.  Objective:  Physical Exam BP 120/76   Pulse 73   LMP  (LMP Unknown)  CONSTITUTIONAL: Well-developed, well-nourished female in no acute distress.  HEENT:  Normocephalic, atraumatic. External right and left ear normal. No scleral icterus.  NECK: Normal range of motion, supple, no masses noted on observation SKIN: No rash noted. Not diaphoretic. No erythema. No pallor. MUSCULOSKELETAL: Normal range of motion. No edema noted. NEUROLOGIC: Alert and oriented to person, place, and time. Normal muscle tone coordination. No cranial nerve deficit noted. PSYCHIATRIC: Normal mood and affect. Normal behavior. Normal judgment and thought content. CARDIOVASCULAR: Normal heart rate noted RESPIRATORY: Effort and breath sounds normal, no problems with respiration noted ABDOMEN: No masses noted. No other overt distention noted.   PELVIC: {Blank single:19197::"Deferred","Normal appearing external genitalia; normal appearing vaginal mucosa and cervix.  No abnormal discharge noted.  Normal uterine size, no other palpable masses, no uterine or adnexal  tenderness."}  Labs and Imaging Results for orders placed or performed in visit on 05/07/18 (from the past 168 hour(s))  CBC   Collection Time: 05/07/18  9:03 AM  Result Value Ref Range   WBC 3.6 3.4 - 10.8 x10E3/uL   RBC 3.93 3.77 - 5.28 x10E6/uL   Hemoglobin 11.8 11.1 - 15.9 g/dL   Hematocrit 35.6 34.0 - 46.6 %   MCV 91 79 - 97 fL   MCH 30.0 26.6 - 33.0 pg   MCHC 33.1 31.5 - 35.7 g/dL   RDW 12.1 11.7 - 15.4 %   Platelets 202 150 - 765 Y65K3/TW  Follicle stimulating hormone   Collection Time: 05/07/18  9:03 AM  Result Value Ref Range   FSH 59.8 mIU/mL  TSH   Collection Time: 05/07/18  9:03 AM  Result Value Ref Range   TSH 2.140 0.450 - 4.500 uIU/mL   No results found.  Assessment & Plan:  1. Fibroids ***  2. Encounter to discuss test results ***   Routine preventative health maintenance measures emphasized. Please refer to After Visit Summary for other counseling recommendations.   No follow-ups on file.   Total face-to-face time with patient: {Blank single:19197::"10","15","20","25","30"} minutes.  Over 50% of encounter was spent on counseling and coordination of care.   Verita Schneiders, MD, [place] for [employer], [place]

## 2018-05-14 NOTE — Patient Instructions (Signed)
Return to clinic for any scheduled appointments or obstetric concerns, or go to [facility] for evaluation  

## 2018-07-30 ENCOUNTER — Other Ambulatory Visit: Payer: Self-pay | Admitting: Obstetrics & Gynecology

## 2018-10-19 ENCOUNTER — Other Ambulatory Visit: Payer: Self-pay | Admitting: Obstetrics & Gynecology

## 2018-10-19 DIAGNOSIS — Z1231 Encounter for screening mammogram for malignant neoplasm of breast: Secondary | ICD-10-CM

## 2018-10-20 ENCOUNTER — Other Ambulatory Visit: Payer: Self-pay

## 2018-10-20 MED ORDER — VALACYCLOVIR HCL 500 MG PO TABS: 500.0000 mg | ORAL_TABLET | Freq: Two times a day (BID) | ORAL | 5 refills | Status: DC

## 2018-10-20 NOTE — Telephone Encounter (Signed)
Patient thinks she is having a outbreak and would like to have some valtrex called into her pharmacy.

## 2018-10-22 ENCOUNTER — Other Ambulatory Visit: Payer: Self-pay

## 2018-10-22 ENCOUNTER — Other Ambulatory Visit (HOSPITAL_COMMUNITY)
Admission: RE | Admit: 2018-10-22 | Discharge: 2018-10-22 | Disposition: A | Discharge status: Home / Self Care | Payer: BC Managed Care – PPO | Source: Ambulatory Visit | Attending: Obstetrics and Gynecology | Admitting: Obstetrics and Gynecology

## 2018-10-22 ENCOUNTER — Encounter: Payer: Self-pay | Admitting: Obstetrics and Gynecology

## 2018-10-22 ENCOUNTER — Ambulatory Visit: Payer: BC Managed Care – PPO | Admitting: Obstetrics and Gynecology

## 2018-10-22 VITALS — BP 105/71 | HR 72 | Ht 66.0 in | Wt 169.0 lb

## 2018-10-22 DIAGNOSIS — N898 Other specified noninflammatory disorders of vagina: Secondary | ICD-10-CM | POA: Insufficient documentation

## 2018-10-22 DIAGNOSIS — A6009 Herpesviral infection of other urogenital tract: Secondary | ICD-10-CM

## 2018-10-22 MED ORDER — LIDOCAINE HCL URETHRAL/MUCOSAL 2 % EX GEL: 1.0000 "application " | Freq: Two times a day (BID) | CUTANEOUS | 1 refills | Status: DC | PRN

## 2018-10-22 NOTE — Progress Notes (Signed)
Obstetrics and Gynecology Visit Return Patient Evaluation  Appointment Date: 10/22/2018  Primary Care Provider: Any[place], [place] for [place]  Chief Complaint: vaginal discharge, GU/left thigh lesion  History of Present Illness:  [name] is a 53 y.o. with above CC. Pt called office and bid valtrex sent in yesterday. Lesions came about on Monday and discharge soon thereafter. Pt also noting some tingling on the right labia. Pt came in for eval due to worsening discomfort and b/c discharge also has smell and looks brown  Pt denies any new sexual partners.   No VB, spotting.   Review of Systems:  as noted in the History of Present Illness.  Medications:  Dagmar Hait had no medications administered during this visit. Current Outpatient Medications  Medication Sig Dispense Refill  . etodolac (LODINE) 500 MG tablet Take 500 mg by mouth.    . ferrous sulfate 325 (65 FE) MG tablet TAKE 1 TABLET (325 MG TOTAL) BY MOUTH 2 (TWO) TIMES DAILY WITH A MEAL. 412 tablet 2  . folic acid (FOLVITE) 1 MG tablet Take 1 mg by mouth daily.    . methotrexate (RHEUMATREX) 2.5 MG tablet Take 2.5 mg by mouth once a week. Reported on 07/21/2015    . valACYclovir (VALTREX) 500 MG tablet Take 1 tablet (500 mg total) by mouth daily as needed. For outbreaks 30 tablet 3  . lidocaine (XYLOCAINE) 2 % jelly Apply 1 application topically 2 (two) times daily as needed. 30 mL 1  . Ospemifene 60 MG TABS Take 1 tablet by mouth daily. (Patient not taking: Reported on 05/07/2018) 90 tablet 5  . triamcinolone cream (KENALOG) 0.1 % Apply topically. Reported on 09/20/2015    . valACYclovir (VALTREX) 500 MG tablet Take 1 tablet (500 mg total) by mouth 2 (two) times daily. 60 tablet 5   No current facility-administered medications for this visit.     Allergies: is allergic to latex.  Physical Exam:  BP 105/71   Pulse 72   Ht 5\' 6"  (1.676 m)   Wt 169 lb (76.7 kg)    BMI 27.28 kg/m  Body mass index is 27.28 kg/m. General appearance: Well nourished, well developed female in no acute distress.  Neuro/Psych:  Normal mood and affect.    Pelvic exam:  No inguinal LAD. On left inner thigh near the inguinal fold is a 2cm lesion and 1cm lesion at the 4 o'clock position approx 3cm from the clitoris on the labia majora EGBUS:  Vaginal vault: scant normal white d/c.  Cervix: within normal limits   Assessment: pt stable, hsv lesions x 2  Plan:  1. Vaginal discharge - Cervicovaginal ancillary only( [facility])  2. Herpes genitalis in women Continue valtrex and lidocaine jelly sent in. Pt has refills and recommended to try and take it as soon as prodromal s/s start next time. If not feeling better/healed in one month, pt told to call for follow up   RTC: prn  Durene Romans MD Attending Center for [facility])

## 2018-10-22 NOTE — Progress Notes (Signed)
Having vaginal discharge with odor.  Having an HSV outbreak.  Just started her Valtrex yesterday.

## 2018-10-26 LAB — CERVICOVAGINAL ANCILLARY ONLY
Bacterial vaginitis: POSITIVE — AB
Candida vaginitis: NEGATIVE
Chlamydia: NEGATIVE
Neisseria Gonorrhea: NEGATIVE
Trichomonas: NEGATIVE

## 2018-10-27 ENCOUNTER — Other Ambulatory Visit: Payer: Self-pay

## 2018-10-27 MED ORDER — METRONIDAZOLE 500 MG PO TABS: 500.0000 mg | ORAL_TABLET | Freq: Two times a day (BID) | ORAL | 0 refills | Status: DC

## 2018-10-27 NOTE — Telephone Encounter (Signed)
Patient has BV and will need to be treated with Flagyl 500 mg PO BID for 7 days.

## 2018-10-28 ENCOUNTER — Encounter: Payer: Self-pay | Admitting: Radiology

## 2018-11-30 ENCOUNTER — Other Ambulatory Visit: Payer: Self-pay

## 2018-11-30 ENCOUNTER — Ambulatory Visit
Admission: RE | Admit: 2018-11-30 | Discharge: 2018-11-30 | Disposition: A | Discharge status: Home / Self Care | Payer: BC Managed Care – PPO | Source: Ambulatory Visit | Attending: Obstetrics & Gynecology | Admitting: Obstetrics & Gynecology

## 2018-11-30 DIAGNOSIS — Z1231 Encounter for screening mammogram for malignant neoplasm of breast: Secondary | ICD-10-CM

## 2018-12-01 ENCOUNTER — Encounter: Payer: Self-pay | Admitting: Obstetrics & Gynecology

## 2018-12-01 ENCOUNTER — Ambulatory Visit (INDEPENDENT_AMBULATORY_CARE_PROVIDER_SITE_OTHER): Payer: BC Managed Care – PPO | Admitting: Obstetrics & Gynecology

## 2018-12-01 VITALS — BP 115/77 | HR 69 | Ht 66.0 in | Wt 167.0 lb

## 2018-12-01 DIAGNOSIS — Z01419 Encounter for gynecological examination (general) (routine) without abnormal findings: Secondary | ICD-10-CM | POA: Diagnosis not present

## 2018-12-01 DIAGNOSIS — D219 Benign neoplasm of connective and other soft tissue, unspecified: Secondary | ICD-10-CM

## 2018-12-01 NOTE — Patient Instructions (Signed)

## 2018-12-01 NOTE — Progress Notes (Signed)
GYNECOLOGY ANNUAL PREVENTATIVE CARE ENCOUNTER NOTE  History:     [name] is a 53 y.o. G20P1001 female here for a routine annual gynecologic exam.  Current complaints: none.  Wants yearly ultrasound to follow up fibroids.   Denies abnormal vaginal bleeding, discharge, pelvic pain, problems with intercourse or other gynecologic concerns.    Gynecologic History No LMP recorded. Patient has had an ablation. Contraception: none Last Pap: 10/14/2017. Results were: normal with negative HPV Last mammogram: 11/30/2018. Results were: normal  Obstetric History OB History  Gravida Para Term Preterm AB Living  1 1 1     1   SAB TAB Ectopic Multiple Live Births          1    # Outcome Date GA Lbr Len/2nd Weight Sex Delivery Anes PTL Lv  1 Term 10/01/97    [name]    Past Medical History:  Diagnosis Date  . Anemia    on iron pills  . Fibroids 06/29/2015  . Herpes     Past Surgical History:  Procedure Laterality Date  . HYSTEROSCOPY W/ ENDOMETRIAL ABLATION  07/09/2011   HTA  . Right Wrist Surgery    . WISDOM TOOTH EXTRACTION      Current Outpatient Medications on File Prior to Visit  Medication Sig Dispense Refill  . valACYclovir (VALTREX) 500 MG tablet Take 1 tablet (500 mg total) by mouth daily as needed. For outbreaks 30 tablet 3  . valACYclovir (VALTREX) 500 MG tablet Take 1 tablet (500 mg total) by mouth 2 (two) times daily. 60 tablet 5  . etodolac (LODINE) 500 MG tablet Take 500 mg by mouth.    . ferrous sulfate 325 (65 FE) MG tablet TAKE 1 TABLET (325 MG TOTAL) BY MOUTH 2 (TWO) TIMES DAILY WITH A MEAL. (Patient not taking: Reported on 12/01/2018) 263 tablet 2  . folic acid (FOLVITE) 1 MG tablet Take 1 mg by mouth daily.    Marland Kitchen lidocaine (XYLOCAINE) 2 % jelly Apply 1 application topically 2 (two) times daily as needed. (Patient not taking: Reported on 12/01/2018) 30 mL 1  . methotrexate (RHEUMATREX) 2.5 MG tablet Take 2.5 mg by mouth once a week. Reported on 07/21/2015     . metroNIDAZOLE (FLAGYL) 500 MG tablet Take 1 tablet (500 mg total) by mouth 2 (two) times daily. (Patient not taking: Reported on 12/01/2018) 14 tablet 0  . Ospemifene 60 MG TABS Take 1 tablet by mouth daily. (Patient not taking: Reported on 05/07/2018) 90 tablet 5  . triamcinolone cream (KENALOG) 0.1 % Apply topically. Reported on 09/20/2015    . [DISCONTINUED] medroxyPROGESTERone (PROVERA) 10 MG tablet Take 2 tablets (20 mg total) by mouth daily. 30 tablet 2   No current facility-administered medications on file prior to visit.     Allergies  Allergen Reactions  . Latex Hives    Social History:  reports that she has never smoked. She has never used smokeless tobacco. She reports that she does not drink alcohol or use drugs.  Family History  Problem Relation Age of Onset  . Cancer Mother 27       ovarian  . Breast cancer Mother   . Cancer Paternal Grandmother        BREAST  . Breast cancer Maternal Grandmother    The following portions of the patient's history were reviewed and updated as appropriate: allergies, current medications, past family history, past medical history, past social history, past surgical history and problem list.  Review of Systems Pertinent items noted in HPI and remainder of comprehensive ROS otherwise negative.  Physical Exam:  BP 115/77   Pulse 69   Ht 5\' 6"  (1.676 m)   Wt 167 lb (75.8 kg)   BMI 26.95 kg/m  CONSTITUTIONAL: Well-developed, well-nourished female in no acute distress.  HENT:  Normocephalic, atraumatic, External right and left ear normal. Oropharynx is clear and moist EYES: Conjunctivae and EOM are normal. Pupils are equal, round, and reactive to light. No scleral icterus.  NECK: Normal range of motion, supple, no masses.  Normal thyroid.  SKIN: Skin is warm and dry. No rash noted. Not diaphoretic. No erythema. No pallor. MUSCULOSKELETAL: Normal range of motion. No tenderness.  No cyanosis, clubbing, or edema.  2+ distal pulses.  NEUROLOGIC: Alert and oriented to person, place, and time. Normal reflexes, muscle tone coordination. No cranial nerve deficit noted. PSYCHIATRIC: Normal mood and affect. Normal behavior. Normal judgment and thought content. CARDIOVASCULAR: Normal heart rate noted, regular rhythm RESPIRATORY: Clear to auscultation bilaterally. Effort and breath sounds normal, no problems with respiration noted. BREASTS: Symmetric in size. No masses, skin changes, nipple drainage, or lymphadenopathy. ABDOMEN: Soft, normal bowel sounds, enlarged fibroid uterus palpated. No other overt distention noted.  No tenderness, rebound or guarding.  PELVIC: Normal appearing external genitalia; normal appearing distal vaginal mucosa.  No abnormal discharge noted.  Internal exam deferred.    Assessment and Plan:    1. Fibroids Follow up scan ordered for fibroids.   - US PELVIC COMPLETE WITH TRANSVAGINAL; Future  2. Well woman exam with routine gynecological exam She is up to date on pap and mammogram.  Routine preventative health maintenance measures emphasized. Please refer to After Visit Summary for other counseling recommendations.      Verita Schneiders, MD, [place] for [employer], [place]

## 2019-03-04 ENCOUNTER — Other Ambulatory Visit (HOSPITAL_COMMUNITY)
Admission: RE | Admit: 2019-03-04 | Discharge: 2019-03-04 | Disposition: A | Discharge status: Home / Self Care | Payer: BC Managed Care – PPO | Source: Ambulatory Visit | Attending: Obstetrics and Gynecology | Admitting: Obstetrics and Gynecology

## 2019-03-04 ENCOUNTER — Ambulatory Visit (INDEPENDENT_AMBULATORY_CARE_PROVIDER_SITE_OTHER): Payer: BC Managed Care – PPO | Admitting: *Deleted

## 2019-03-04 ENCOUNTER — Other Ambulatory Visit: Payer: Self-pay

## 2019-03-04 VITALS — BP 105/72 | HR 90

## 2019-03-04 DIAGNOSIS — N898 Other specified noninflammatory disorders of vagina: Secondary | ICD-10-CM | POA: Diagnosis not present

## 2019-03-04 MED ORDER — TERCONAZOLE 0.8 % VA CREA: 1.0000 | TOPICAL_CREAM | Freq: Every day | VAGINAL | 0 refills | Status: DC

## 2019-03-04 NOTE — Progress Notes (Signed)
SUBJECTIVE:  54 y.o. female complains of white vaginal discharge and vaginal itching for 1 week(s). Denies abnormal vaginal bleeding or significant pelvic pain or fever. No UTI symptoms. Denies history of known exposure to STD.  No LMP recorded. Patient has had an ablation.  OBJECTIVE:  She appears well, afebrile. Urine dipstick: not done.  ASSESSMENT:  Vaginal Discharge  Vaginal Itching   PLAN:  GC, chlamydia, trichomonas, BVAG, CVAG probe sent to lab. Treatment: To be determined once lab results are received ROV prn if symptoms persist or worsen.

## 2019-03-08 LAB — CERVICOVAGINAL ANCILLARY ONLY
Bacterial Vaginitis (gardnerella): NEGATIVE
Candida Glabrata: NEGATIVE
Candida Vaginitis: POSITIVE — AB
Chlamydia: NEGATIVE
Comment: NEGATIVE
Comment: NEGATIVE
Comment: NEGATIVE
Comment: NEGATIVE
Comment: NEGATIVE
Comment: NORMAL
Neisseria Gonorrhea: NEGATIVE
Trichomonas: NEGATIVE

## 2019-03-09 NOTE — Progress Notes (Signed)
Patient seen and assessed by nursing staff during this encounter. I have reviewed the chart and agree with the documentation and plan.  Aletha Halim, MD 03/09/2019 12:19 AM

## 2019-03-23 ENCOUNTER — Other Ambulatory Visit: Payer: Self-pay

## 2019-03-23 ENCOUNTER — Ambulatory Visit
Admission: RE | Admit: 2019-03-23 | Discharge: 2019-03-23 | Disposition: A | Discharge status: Home / Self Care | Payer: BC Managed Care – PPO | Source: Ambulatory Visit | Attending: Obstetrics & Gynecology | Admitting: Obstetrics & Gynecology

## 2019-03-23 DIAGNOSIS — D219 Benign neoplasm of connective and other soft tissue, unspecified: Secondary | ICD-10-CM | POA: Diagnosis not present

## 2019-09-06 ENCOUNTER — Other Ambulatory Visit: Payer: Self-pay | Admitting: *Deleted

## 2019-09-06 MED ORDER — FERROUS SULFATE 325 (65 FE) MG PO TABS: ORAL_TABLET | ORAL | 2 refills | Status: DC

## 2019-09-28 ENCOUNTER — Telehealth: Payer: BC Managed Care – PPO | Admitting: Obstetrics & Gynecology

## 2019-10-25 ENCOUNTER — Encounter: Payer: Self-pay | Admitting: Podiatry

## 2019-10-25 ENCOUNTER — Other Ambulatory Visit: Payer: Self-pay

## 2019-10-27 ENCOUNTER — Other Ambulatory Visit: Payer: Self-pay | Admitting: Podiatry

## 2019-11-02 ENCOUNTER — Other Ambulatory Visit: Admission: RE | Admit: 2019-11-02 | Payer: BC Managed Care – PPO | Source: Ambulatory Visit

## 2019-11-04 ENCOUNTER — Ambulatory Visit: Payer: BC Managed Care – PPO | Admitting: Anesthesiology

## 2019-11-04 ENCOUNTER — Other Ambulatory Visit: Payer: Self-pay

## 2019-11-04 ENCOUNTER — Encounter: Payer: Self-pay | Admitting: Podiatry

## 2019-11-04 ENCOUNTER — Encounter
Admission: RE | Disposition: A | Discharge status: Home / Self Care | Payer: Self-pay | Source: Home / Self Care | Attending: Podiatry

## 2019-11-04 ENCOUNTER — Ambulatory Visit
Admission: RE | Admit: 2019-11-04 | Discharge: 2019-11-04 | Disposition: A | Discharge status: Home / Self Care | Payer: BC Managed Care – PPO | Attending: Podiatry | Admitting: Podiatry

## 2019-11-04 DIAGNOSIS — G8929 Other chronic pain: Secondary | ICD-10-CM | POA: Diagnosis not present

## 2019-11-04 DIAGNOSIS — M2012 Hallux valgus (acquired), left foot: Secondary | ICD-10-CM | POA: Diagnosis not present

## 2019-11-04 DIAGNOSIS — D649 Anemia, unspecified: Secondary | ICD-10-CM | POA: Insufficient documentation

## 2019-11-04 DIAGNOSIS — Z79899 Other long term (current) drug therapy: Secondary | ICD-10-CM | POA: Insufficient documentation

## 2019-11-04 DIAGNOSIS — M2011 Hallux valgus (acquired), right foot: Secondary | ICD-10-CM | POA: Insufficient documentation

## 2019-11-04 HISTORY — PX: AIKEN OSTEOTOMY: SHX6331

## 2019-11-04 SURGERY — BUNIONECTOMY, AKIN
Anesthesia: General | Site: Toe | Laterality: Bilateral

## 2019-11-04 MED ORDER — ACETAMINOPHEN 325 MG PO TABS: 325.0000 mg | ORAL_TABLET | ORAL | Status: DC | PRN

## 2019-11-04 MED ORDER — OXYCODONE HCL 5 MG/5ML PO SOLN: 5.0000 mg | Freq: Once | ORAL | Status: DC | PRN

## 2019-11-04 MED ORDER — CEFAZOLIN SODIUM-DEXTROSE 2-4 GM/100ML-% IV SOLN
2.0000 g | INTRAVENOUS | Status: AC
  Administered 2019-11-04: 2 g via INTRAVENOUS

## 2019-11-04 MED ORDER — LIDOCAINE HCL (CARDIAC) PF 100 MG/5ML IV SOSY
PREFILLED_SYRINGE | INTRAVENOUS | Status: DC | PRN
  Administered 2019-11-04: 60 mg via INTRATRACHEAL

## 2019-11-04 MED ORDER — OXYCODONE-ACETAMINOPHEN 7.5-325 MG PO TABS: 1.0000 | ORAL_TABLET | ORAL | 0 refills | Status: DC | PRN

## 2019-11-04 MED ORDER — PROPOFOL 10 MG/ML IV BOLUS
INTRAVENOUS | Status: DC | PRN
  Administered 2019-11-04: 150 mg via INTRAVENOUS

## 2019-11-04 MED ORDER — DEXAMETHASONE SODIUM PHOSPHATE 4 MG/ML IJ SOLN
INTRAMUSCULAR | Status: DC | PRN
  Administered 2019-11-04: 4 mg via INTRAVENOUS

## 2019-11-04 MED ORDER — BUPIVACAINE LIPOSOME 1.3 % IJ SUSP
INTRAMUSCULAR | Status: DC | PRN
  Administered 2019-11-04: 3.5 mL
  Administered 2019-11-04 (×2): 3 mL

## 2019-11-04 MED ORDER — FENTANYL CITRATE (PF) 100 MCG/2ML IJ SOLN: 25.0000 ug | INTRAMUSCULAR | Status: DC | PRN

## 2019-11-04 MED ORDER — BUPIVACAINE HCL 0.25 % IJ SOLN
INTRAMUSCULAR | Status: DC | PRN
  Administered 2019-11-04: 3.5 mL

## 2019-11-04 MED ORDER — PHENYLEPHRINE HCL (PRESSORS) 10 MG/ML IV SOLN
INTRAVENOUS | Status: DC | PRN
Start: 2019-11-04 — End: 2019-11-04
  Administered 2019-11-04: 100 ug via INTRAVENOUS
  Administered 2019-11-04: 200 ug via INTRAVENOUS
  Administered 2019-11-04 (×2): 100 ug via INTRAVENOUS
  Administered 2019-11-04: 200 ug via INTRAVENOUS
  Administered 2019-11-04 (×2): 100 ug via INTRAVENOUS

## 2019-11-04 MED ORDER — ACETAMINOPHEN 160 MG/5ML PO SOLN: 325.0000 mg | ORAL | Status: DC | PRN

## 2019-11-04 MED ORDER — MIDAZOLAM HCL 5 MG/5ML IJ SOLN
INTRAMUSCULAR | Status: DC | PRN
  Administered 2019-11-04: 2 mg via INTRAVENOUS

## 2019-11-04 MED ORDER — LACTATED RINGERS IV SOLN: INTRAVENOUS | Status: DC

## 2019-11-04 MED ORDER — POVIDONE-IODINE 7.5 % EX SOLN: Freq: Once | CUTANEOUS | Status: AC

## 2019-11-04 MED ORDER — FENTANYL CITRATE (PF) 100 MCG/2ML IJ SOLN
INTRAMUSCULAR | Status: DC | PRN
  Administered 2019-11-04: 50 ug via INTRAVENOUS

## 2019-11-04 MED ORDER — ONDANSETRON HCL 4 MG/2ML IJ SOLN
INTRAMUSCULAR | Status: DC | PRN
  Administered 2019-11-04: 4 mg via INTRAVENOUS

## 2019-11-04 MED ORDER — OXYCODONE HCL 5 MG PO TABS: 5.0000 mg | ORAL_TABLET | Freq: Once | ORAL | Status: DC | PRN

## 2019-11-04 MED ORDER — ONDANSETRON HCL 4 MG/2ML IJ SOLN: 4.0000 mg | Freq: Once | INTRAMUSCULAR | Status: DC | PRN

## 2019-11-04 SURGICAL SUPPLY — 53 items
APL SKNCLS STERI-STRIP NONHPOA (GAUZE/BANDAGES/DRESSINGS) ×2
BENZOIN TINCTURE PRP APPL 2/3 (GAUZE/BANDAGES/DRESSINGS) ×5 IMPLANT
BLADE MINI RND TIP GREEN BEAV (BLADE) ×2 IMPLANT
BLADE OSC/SAGITTAL MD 5.5X18 (BLADE) ×2 IMPLANT
BNDG CMPR 75X41 PLY HI ABS (GAUZE/BANDAGES/DRESSINGS) ×1
BNDG ELASTIC 4X5.8 VLCR STR LF (GAUZE/BANDAGES/DRESSINGS) ×2 IMPLANT
BNDG ESMARK 4X12 TAN STRL LF (GAUZE/BANDAGES/DRESSINGS) ×3 IMPLANT
BNDG GAUZE 4.5X4.1 6PLY STRL (MISCELLANEOUS) ×5 IMPLANT
BNDG STRETCH 4X75 STRL LF (GAUZE/BANDAGES/DRESSINGS) ×3 IMPLANT
CANISTER SUCT 1200ML W/VALVE (MISCELLANEOUS) ×3 IMPLANT
CAST PADDING 3X4FT ST 30246 (SOFTGOODS) ×8
CLIP FIXATION STAPLE 10X10X10 (Staple) ×6 IMPLANT
CLOSURE WOUND 1/2 X4 (GAUZE/BANDAGES/DRESSINGS) ×2
CLOSURE WOUND 1/4X4 (GAUZE/BANDAGES/DRESSINGS) ×1
COVER LIGHT HANDLE UNIVERSAL (MISCELLANEOUS) ×6 IMPLANT
CUFF TOURN SGL QUICK 18X4 (TOURNIQUET CUFF) ×4 IMPLANT
DRAPE FLUOR MINI C-ARM 54X84 (DRAPES) ×3 IMPLANT
DURAPREP 26ML APPLICATOR (WOUND CARE) ×5 IMPLANT
ELECT REM PT RETURN 9FT ADLT (ELECTROSURGICAL) ×3
ELECTRODE REM PT RTRN 9FT ADLT (ELECTROSURGICAL) ×1 IMPLANT
GAUZE SPONGE 4X4 12PLY STRL (GAUZE/BANDAGES/DRESSINGS) ×5 IMPLANT
GAUZE XEROFORM 1X8 LF (GAUZE/BANDAGES/DRESSINGS) ×5 IMPLANT
GLOVE BIO SURGEON STRL SZ8 (GLOVE) ×3 IMPLANT
GOWN STRL REUS W/ TWL LRG LVL3 (GOWN DISPOSABLE) ×1 IMPLANT
GOWN STRL REUS W/ TWL XL LVL3 (GOWN DISPOSABLE) ×1 IMPLANT
GOWN STRL REUS W/TWL LRG LVL3 (GOWN DISPOSABLE) ×3
GOWN STRL REUS W/TWL XL LVL3 (GOWN DISPOSABLE) ×3
K-WIRE DBL END TROCAR 6X.045 (WIRE) ×6
K-WIRE DBL END TROCAR 6X.062 (WIRE)
KIT PROCEDURE DRILL (DRILL) ×2 IMPLANT
KIT TURNOVER KIT A (KITS) ×3 IMPLANT
KWIRE DBL END TROCAR 6X.045 (WIRE) IMPLANT
KWIRE DBL END TROCAR 6X.062 (WIRE) IMPLANT
NDL HYPO 18GX1.5 BLUNT FILL (NEEDLE) IMPLANT
NDL HYPO 25GX1X1/2 BEV (NEEDLE) IMPLANT
NEEDLE HYPO 18GX1.5 BLUNT FILL (NEEDLE) ×3 IMPLANT
NEEDLE HYPO 25GX1X1/2 BEV (NEEDLE) ×3 IMPLANT
NS IRRIG 500ML POUR BTL (IV SOLUTION) ×3 IMPLANT
PACK EXTREMITY ARMC (MISCELLANEOUS) ×3 IMPLANT
PAD CAST CTTN 3X4 STRL (SOFTGOODS) IMPLANT
PADDING CAST COTTON 3X4 STRL (SOFTGOODS) ×4
PENCIL SMOKE EVACUATOR (MISCELLANEOUS) ×3 IMPLANT
SPLINT CAST 1 STEP 5X30 WHT (MISCELLANEOUS) ×4 IMPLANT
SPLINT FAST PLASTER 5X30 (CAST SUPPLIES) ×40
SPLINT PLASTER CAST FAST 5X30 (CAST SUPPLIES) IMPLANT
STOCKINETTE STRL 6IN 960660 (GAUZE/BANDAGES/DRESSINGS) ×5 IMPLANT
STRAP BODY AND KNEE 60X3 (MISCELLANEOUS) ×3 IMPLANT
STRIP CLOSURE SKIN 1/2X4 (GAUZE/BANDAGES/DRESSINGS) ×2 IMPLANT
STRIP CLOSURE SKIN 1/4X4 (GAUZE/BANDAGES/DRESSINGS) ×2 IMPLANT
SUT VIC AB 4-0 FS2 27 (SUTURE) ×3 IMPLANT
SUT VIC AB 4-0 SH 27 (SUTURE) ×3
SUT VIC AB 4-0 SH 27XANBCTRL (SUTURE) IMPLANT
SYR 10ML LL (SYRINGE) ×2 IMPLANT

## 2019-11-04 NOTE — Discharge Instructions (Signed)
[facility]  POST OPERATIVE INSTRUCTIONS FOR DR. TROXLER, DR. FOWLER, AND DR. [facility] PODIATRY DEPARTMENT   1. Take your medication as prescribed.  Pain medication should be taken only as needed.  2. Keep the dressing clean, dry and intact.  3. Keep your foot elevated above the heart level for the first 48 hours.  4. Walking to the bathroom and brief periods of walking are acceptable, unless we have instructed you to be non-weight bearing.  5. Always wear your post-op shoe when walking.  Always use your crutches if you are to be non-weight bearing.  6. Do not take a shower. Baths are permissible as long as the foot is kept out of the water.   7. Every hour you are awake:  - Bend your knee 15 times. - Flex foot 15 times - Massage calf 15 times  8. Call [facility] ([phone]) if any of the following problems occur: - You develop a temperature or fever. - The bandage becomes saturated with blood. - Medication does not stop your pain. - Injury of the foot occurs. - Any symptoms of infection including redness, odor, or red streaks running from wound.   General Anesthesia, Adult, Care After This sheet gives you information about how to care for yourself after your procedure. Your health care provider may also give you more specific instructions. If you have problems or questions, contact your health care provider. What can I expect after the procedure? After the procedure, the following side effects are common:  Pain or discomfort at the IV site.  Nausea.  Vomiting.  Sore throat.  Trouble concentrating.  Feeling cold or chills.  Weak or tired.  Sleepiness and fatigue.  Soreness and body aches. These side effects can affect parts of the body that were not involved in surgery. Follow these instructions at home:  For at least 24 hours after the procedure:  Have a responsible adult stay with you. It is  important to have someone help care for you until you are awake and alert.  Rest as needed.  Do not: ? Participate in activities in which you could fall or become injured. ? Drive. ? Use heavy machinery. ? Drink alcohol. ? Take sleeping pills or medicines that cause drowsiness. ? Make important decisions or sign legal documents. ? Take care of children on your own. Eating and drinking  Follow any instructions from your health care provider about eating or drinking restrictions.  When you feel hungry, start by eating small amounts of foods that are soft and easy to digest (bland), such as toast. Gradually return to your regular diet.  Drink enough fluid to keep your urine pale yellow.  If you vomit, rehydrate by drinking water, juice, or clear broth. General instructions  If you have sleep apnea, surgery and certain medicines can increase your risk for breathing problems. Follow instructions from your health care provider about wearing your sleep device: ? Anytime you are sleeping, including during daytime naps. ? While taking prescription pain medicines, sleeping medicines, or medicines that make you drowsy.  Return to your normal activities as told by your health care provider. Ask your health care provider what activities are safe for you.  Take over-the-counter and prescription medicines only as told by your health care provider.  If you smoke, do not smoke without supervision.  Keep all follow-up visits as told by your health care provider. This is important. Contact a health care provider if:    You have nausea or vomiting that does not get better with medicine.  You cannot eat or drink without vomiting.  You have pain that does not get better with medicine.  You are unable to pass urine.  You develop a skin rash.  You have a fever.  You have redness around your IV site that gets worse. Get help right away if:  You have difficulty breathing.  You have chest  pain.  You have blood in your urine or stool, or you vomit blood. Summary  After the procedure, it is common to have a sore throat or nausea. It is also common to feel tired.  Have a responsible adult stay with you for the first 24 hours after general anesthesia. It is important to have someone help care for you until you are awake and alert.  When you feel hungry, start by eating small amounts of foods that are soft and easy to digest (bland), such as toast. Gradually return to your regular diet.  Drink enough fluid to keep your urine pale yellow.  Return to your normal activities as told by your health care provider. Ask your health care provider what activities are safe for you. This information is not intended to replace advice given to you by your health care provider. Make sure you discuss any questions you have with your health care provider. Document Revised: 04/04/2017 Document Reviewed: 11/15/2016 Elsevier Patient Education  2020 [place] for Discharge Teaching: EXPAREL (bupivacaine liposome injectable suspension)   Your surgeon or anesthesiologist gave you EXPAREL(bupivacaine) to help control your pain after surgery.   EXPAREL is a local anesthetic that provides pain relief by numbing the tissue around the surgical site.  EXPAREL is designed to release pain medication over time and can control pain for up to 72 hours.  Depending on how you respond to EXPAREL, you may require less pain medication during your recovery.  Possible side effects:  Temporary loss of sensation or ability to move in the area where bupivacaine was injected.  Nausea, vomiting, constipation  Rarely, numbness and tingling in your mouth or lips, lightheadedness, or anxiety may occur.  Call your doctor right away if you think you may be experiencing any of these sensations, or if you have other questions regarding possible side effects.  Follow all other discharge instructions  given to you by your surgeon or nurse. Eat a healthy diet and drink plenty of water or other fluids.  If you return to the hospital for any reason within 96 hours following the administration of EXPAREL, it is important for health care providers to know that you have received this anesthetic. A teal colored band has been placed on your arm with the date, time and amount of EXPAREL you have received in order to alert and inform your health care providers. Please leave this armband in place for the full 96 hours following administration, and then you may remove the band.

## 2019-11-04 NOTE — Op Note (Signed)
Operative note   Surgeon: Dr. Albertine Patricia, DPM.    Assistant: None    Preop diagnosis: Hallux valgus bilateral    Postop diagnosis: Same    Procedure:   1.  Akin osteotomy proximal phalanx hallux right foot   2.  Akin osteotomy proximal phalanx hallux left foot        EBL: Less than 5 cc    Anesthesia:general delivered by anesthesia team I injected her preoperatively with 0.25% Marcaine mixed with Exparel used 7 cc total.  Injected same wound postoperatively of straight Exparel.    Hemostasis: Ankle tourniquet 225 mils mercury pressure 30 minutes right and 40 minutes left    Specimen: None    Complications: None    Operative indications: Chronic pain along the IP joints of the great toe due to deformity of the digit.  She has been unresponsive to conservative care.    Procedure:  Patient was brought into the OR and placed on the operating table in thesupine position. After anesthesia was obtained theleft and right lower extremities was prepped and draped in usual sterile fashion.  Operative Report: This time attention was directed to the right foot over the great toe proximal phalanx shaft a 3 cm linear incision made over this area deepened sharp blunt dissection bleeders clamped bovied as required.  Periosteum was then incised longitudinally reflected medial laterally from the proximal phalanx.  Tissue and capsule tissue was also cleaned away from the medial IP joint and a power saw was used to resect the bony proliferation prominence off of the distal phalanx medial base and the medial proximal phalanx head.  Once this was removed attention was directed to the midshaft area of the proximal phalanx where an osteotomy was performed.  There was a V fashion to it medially but the osteotomy was carried intentionally from medial to lateral to allow for frontal plane rotation as well.  Once this was accomplished there was fixated with a point of 5 K wire temporarily check FluoroScan.   Good position correction were noted.  At this point a 8 mm staple was placed across the area checked for skin good position and correction were noted.  There was an copiously irrigated the K wire removed and capsular tissue was closed with 4-0 Vicryl in a continuous stitch as were deep superficial fascial layers.  Skin was closed with 4-0 Vicryl in a subcuticular fashion.  At this point after the Exparel injection a sterile dressing was placed across wound assisting of Steri-Strips Xeroform gauze 4 x 4's Kling and Kerlix.  Tourniquet was released prior to complete vascularity was seen to return to all digits of the right foot.  At this time attention was directed to the left foot where similar procedure was performed.  A 12 mm staple was utilized instead of the 10 for fixation.    Patient tolerated the procedure and anesthesia well.  Was transported from the OR to the PACU with all vital signs stable and vascular status intact. To be discharged per routine protocol.  Will follow up in approximately 1 week in the outpatient clinic.

## 2019-11-04 NOTE — Anesthesia Postprocedure Evaluation (Signed)
Anesthesia Post Note  Patient: [name]  Procedure(s) Performed: PHALANX OSTEOTOMY-AKIN BILATERAL (Bilateral Toe)     Patient location during evaluation: PACU Anesthesia Type: General Level of consciousness: awake and alert and oriented Pain management: satisfactory to patient Vital Signs Assessment: post-procedure vital signs reviewed and stable Respiratory status: spontaneous breathing, nonlabored ventilation and respiratory function stable Cardiovascular status: blood pressure returned to baseline and stable Postop Assessment: Adequate PO intake and No signs of nausea or vomiting Anesthetic complications: no   No complications documented.  Raliegh Ip

## 2019-11-04 NOTE — Transfer of Care (Addendum)
Immediate Anesthesia Transfer of Care Note  Patient: [name]  Procedure(s) Performed: PHALANX OSTEOTOMY-AKIN BILATERAL (Bilateral Toe)  Patient Location: PACU  Anesthesia Type: General LMA  Level of Consciousness: awake, alert  and patient cooperative  Airway and Oxygen Therapy: Patient Spontanous Breathing and Patient connected to supplemental oxygen  Post-op Assessment: Post-op Vital signs reviewed, Patient's Cardiovascular Status Stable, Respiratory Function Stable, Patent Airway and No signs of Nausea or vomiting  Post-op Vital Signs: Reviewed and stable  Complications: No complications documented.

## 2019-11-04 NOTE — H&P (Signed)
H and P has been reviewed and no changes are noted.  

## 2019-11-04 NOTE — Anesthesia Preprocedure Evaluation (Signed)
Anesthesia Evaluation  Patient identified by MRN, date of birth, ID band Patient awake    Reviewed: Allergy & Precautions, H&P , NPO status , Patient's Chart, lab work & pertinent test results  Airway Mallampati: II  TM Distance: >3 FB Neck ROM: full    Dental no notable dental hx.    Pulmonary    Pulmonary exam normal breath sounds clear to auscultation       Cardiovascular Normal cardiovascular exam Rhythm:regular Rate:Normal     Neuro/Psych    GI/Hepatic   Endo/Other    Renal/GU      Musculoskeletal   Abdominal   Peds  Hematology  (+) Blood dyscrasia, anemia ,   Anesthesia Other Findings   Reproductive/Obstetrics                             Anesthesia Physical Anesthesia Plan  ASA: II  Anesthesia Plan: General LMA   Post-op Pain Management:    Induction:   PONV Risk Score and Plan: 3 and Treatment may vary due to age or medical condition, Ondansetron, Dexamethasone and Midazolam  Airway Management Planned:   Additional Equipment:   Intra-op Plan:   Post-operative Plan:   Informed Consent: I have reviewed the patients History and Physical, chart, labs and discussed the procedure including the risks, benefits and alternatives for the proposed anesthesia with the patient or authorized representative who has indicated his/her understanding and acceptance.     Dental Advisory Given  Plan Discussed with: CRNA  Anesthesia Plan Comments:         Anesthesia Quick Evaluation

## 2019-11-04 NOTE — Anesthesia Procedure Notes (Signed)
Procedure Name: LMA Insertion Date/Time: 11/04/2019 10:07 AM Performed by: Silvana Newness, CRNA Pre-anesthesia Checklist: Patient identified, Emergency Drugs available, Suction available, Patient being monitored and Timeout performed Patient Re-evaluated:Patient Re-evaluated prior to induction Oxygen Delivery Method: Circle system utilized Preoxygenation: Pre-oxygenation with 100% oxygen Induction Type: IV induction Ventilation: Mask ventilation without difficulty LMA: LMA inserted LMA Size: 4.0 Number of attempts: 1 Placement Confirmation: positive ETCO2 and breath sounds checked- equal and bilateral Tube secured with: Tape Dental Injury: Teeth and Oropharynx as per pre-operative assessment

## 2019-11-05 ENCOUNTER — Encounter: Payer: Self-pay | Admitting: Podiatry

## 2019-11-11 ENCOUNTER — Other Ambulatory Visit: Payer: Self-pay | Admitting: Obstetrics & Gynecology

## 2019-11-11 DIAGNOSIS — Z1231 Encounter for screening mammogram for malignant neoplasm of breast: Secondary | ICD-10-CM

## 2019-12-03 ENCOUNTER — Other Ambulatory Visit: Payer: Self-pay

## 2019-12-03 ENCOUNTER — Ambulatory Visit
Admission: RE | Admit: 2019-12-03 | Discharge: 2019-12-03 | Disposition: A | Discharge status: Home / Self Care | Payer: BC Managed Care – PPO | Source: Ambulatory Visit | Attending: Obstetrics & Gynecology | Admitting: Obstetrics & Gynecology

## 2019-12-03 DIAGNOSIS — Z1231 Encounter for screening mammogram for malignant neoplasm of breast: Secondary | ICD-10-CM

## 2019-12-13 ENCOUNTER — Other Ambulatory Visit: Payer: Self-pay

## 2019-12-13 ENCOUNTER — Other Ambulatory Visit (HOSPITAL_COMMUNITY)
Admission: RE | Admit: 2019-12-13 | Discharge: 2019-12-13 | Disposition: A | Discharge status: Home / Self Care | Payer: BC Managed Care – PPO | Source: Ambulatory Visit | Attending: Obstetrics & Gynecology | Admitting: Obstetrics & Gynecology

## 2019-12-13 ENCOUNTER — Ambulatory Visit (INDEPENDENT_AMBULATORY_CARE_PROVIDER_SITE_OTHER): Payer: BC Managed Care – PPO | Admitting: Obstetrics & Gynecology

## 2019-12-13 ENCOUNTER — Encounter: Payer: Self-pay | Admitting: Obstetrics & Gynecology

## 2019-12-13 VITALS — BP 133/80 | HR 93 | Ht 66.0 in | Wt 176.0 lb

## 2019-12-13 DIAGNOSIS — Z01419 Encounter for gynecological examination (general) (routine) without abnormal findings: Secondary | ICD-10-CM | POA: Diagnosis not present

## 2019-12-13 DIAGNOSIS — N9089 Other specified noninflammatory disorders of vulva and perineum: Secondary | ICD-10-CM

## 2019-12-13 DIAGNOSIS — N898 Other specified noninflammatory disorders of vagina: Secondary | ICD-10-CM | POA: Diagnosis not present

## 2019-12-13 MED ORDER — FLUCONAZOLE 150 MG PO TABS: 150.0000 mg | ORAL_TABLET | Freq: Once | ORAL | 3 refills | Status: AC

## 2019-12-13 NOTE — Progress Notes (Signed)
GYNECOLOGY ANNUAL PREVENTATIVE CARE ENCOUNTER NOTE  History:     [name] is a 54 y.o. G54P1001 female here for a routine annual gynecologic exam.  Current complaints: itchy white discharge for a few days, wants this evaluated.  No issues with chronic fibroids, declines yearly surveillance ultrasound.   Denies abnormal vaginal bleeding, discharge, pelvic pain, problems with intercourse or other gynecologic concerns.    Gynecologic History No LMP recorded (lmp unknown). Patient is postmenopausal. Contraception: post menopausal status Last Pap: 10/14/2017. Results were: normal with negative HPV Last mammogram: 12/03/2019. Results were: normal  Obstetric History OB History  Gravida Para Term Preterm AB Living  1 1 1     1   SAB TAB Ectopic Multiple Live Births          1    # Outcome Date GA Lbr Len/2nd Weight Sex Delivery Anes PTL Lv  1 Term 10/01/97    [name]    Past Medical History:  Diagnosis Date  . Anemia    on iron pills  . Fibroids 06/29/2015  . Herpes     Past Surgical History:  Procedure Laterality Date  . Barbie Banner OSTEOTOMY Bilateral 11/04/2019   Procedure: PHALANX OSTEOTOMY-AKIN BILATERAL;  Surgeon: Albertine Patricia, DPM;  Location: [facility];  Service: Podiatry;  Laterality: Bilateral;  Latex LMA LOCAL  . HYSTEROSCOPY W/ ENDOMETRIAL ABLATION  07/09/2011   HTA  . Right Wrist Surgery    . WISDOM TOOTH EXTRACTION      Current Outpatient Medications on File Prior to Visit  Medication Sig Dispense Refill  . cetirizine (ZYRTEC) 10 MG tablet Take 10 mg by mouth daily.    . clobetasol ointment (TEMOVATE) 7.85 % Apply 1 application topically 2 (two) times daily as needed.    . ferrous sulfate 325 (65 FE) MG tablet Take dailyTAKE 1 TABLET (325 MG TOTAL) BY MOUTH 2 (TWO) TIMES DAILY WITH A MEAL. 885 tablet 2  . folic acid (FOLVITE) 1 MG tablet Take 1 mg by mouth daily.    . halobetasol (ULTRAVATE) 0.05 % cream Apply topically 2 (two) times daily  as needed.    . methotrexate (RHEUMATREX) 2.5 MG tablet Take 2.5 mg by mouth once a week. Reported on 07/21/2015    . sucralfate (CARAFATE) 1 g tablet Take 1 g by mouth 4 (four) times daily -  with meals and at bedtime.    . valACYclovir (VALTREX) 500 MG tablet Take 1 tablet (500 mg total) by mouth 2 (two) times daily. 60 tablet 5  . lidocaine (XYLOCAINE) 2 % jelly Apply 1 application topically 2 (two) times daily as needed. (Patient not taking: Reported on 12/01/2018) 30 mL 1  . oxyCODONE-acetaminophen (PERCOCET) 7.5-325 MG tablet Take 1 tablet by mouth every 4 (four) hours as needed for severe pain. (Patient not taking: Reported on 12/13/2019) 30 tablet 0  . triamcinolone cream (KENALOG) 0.1 % Apply topically. Reported on 09/20/2015    . [DISCONTINUED] medroxyPROGESTERone (PROVERA) 10 MG tablet Take 2 tablets (20 mg total) by mouth daily. 30 tablet 2   No current facility-administered medications on file prior to visit.    Allergies  Allergen Reactions  . Latex Hives    By test  . Peanut-Containing Drug Products Rash    All nuts    Social History:  reports that she has never smoked. She has never used smokeless tobacco. She reports that she does not drink alcohol and does not use drugs.  Family History  Problem Relation Age of Onset  . Cancer Mother 68       ovarian  . Breast cancer Mother   . Cancer Paternal Grandmother        BREAST  . Breast cancer Maternal Grandmother     The following portions of the patient's history were reviewed and updated as appropriate: allergies, current medications, past family history, past medical history, past social history, past surgical history and problem list.  Review of Systems Pertinent items noted in HPI and remainder of comprehensive ROS otherwise negative.  Physical Exam:  BP 133/80   Pulse 93   Ht 5\' 6"  (1.676 m)   Wt 176 lb (79.8 kg)   LMP  (LMP Unknown)   BMI 28.41 kg/m  CONSTITUTIONAL: Well-developed, well-nourished female in no  acute distress.  HENT:  Normocephalic, atraumatic, External right and left ear normal. Oropharynx is clear and moist EYES: Conjunctivae and EOM are normal. Pupils are equal, round, and reactive to light. No scleral icterus.  NECK: Normal range of motion, supple, no masses.  Normal thyroid.  SKIN: Skin is warm and dry. No rash noted. Not diaphoretic. No erythema. No pallor. MUSCULOSKELETAL: Normal range of motion. No tenderness.  No cyanosis, clubbing, or edema.  2+ distal pulses. NEUROLOGIC: Alert and oriented to person, place, and time. Normal reflexes, muscle tone coordination.  PSYCHIATRIC: Normal mood and affect. Normal behavior. Normal judgment and thought content. CARDIOVASCULAR: Normal heart rate noted, regular rhythm RESPIRATORY: Clear to auscultation bilaterally. Effort and breath sounds normal, no problems with respiration noted. BREASTS: Symmetric in size. No masses, tenderness, skin changes, nipple drainage, or lymphadenopathy bilaterally. Performed in the presence of a chaperone. ABDOMEN: Soft, enlarged fibroid uterus palpated.  No tenderness, rebound or guarding.  PELVIC: Normal appearing external genitalia and urethral meatus; normal appearing vaginal mucosa and cervix.  Thick white vaginal discharge noted.  Pap smear obtained and discharge analysis test obtained.  Enlarged fibroid uterus palpated, no other palpable masses, no uterine or adnexal tenderness.  Performed in the presence of a chaperone.   Assessment and Plan:      1. Vulvar irritation 2. Vaginal discharge Diflucan presumptively prescribed but will follow up test results. Proper vulvar hygiene emphasized. - fluconazole (DIFLUCAN) 150 MG tablet; Take 1 tablet (150 mg total) by mouth once for 1 dose. Can take additional dose three days later if symptoms persist  Dispense: 1 tablet; Refill: 3  3. Well woman exam with routine gynecological exam Desired pap and preventative healthcare maintenance labs - CBC - TSH -  Hemoglobin A1c - Comprehensive metabolic panel - Lipid panel (nonfasting) - Cervicovaginal ancillary only - Cytology - PAP Will follow up results of studies and manage accordingly. Routine preventative health maintenance measures emphasized. Please refer to After Visit Summary for other counseling recommendations.      Verita Schneiders, MD, [place] for [facility], [place]

## 2019-12-13 NOTE — Patient Instructions (Signed)

## 2019-12-14 LAB — HEMOGLOBIN A1C
Est. average glucose Bld gHb Est-mCnc: 120 mg/dL
Hgb A1c MFr Bld: 5.8 % — ABNORMAL HIGH (ref 4.8–5.6)

## 2019-12-14 LAB — LIPID PANEL
Chol/HDL Ratio: 3.2 ratio (ref 0.0–4.4)
Cholesterol, Total: 210 mg/dL — ABNORMAL HIGH (ref 100–199)
HDL: 65 mg/dL (ref >39)
LDL Chol Calc (NIH): 121 mg/dL — ABNORMAL HIGH (ref 0–99)
Triglycerides: 138 mg/dL (ref 0–149)
VLDL Cholesterol Cal: 24 mg/dL (ref 5–40)

## 2019-12-14 LAB — COMPREHENSIVE METABOLIC PANEL
ALT: 17 IU/L (ref 0–32)
AST: 23 IU/L (ref 0–40)
Albumin/Globulin Ratio: 1.7 (ref 1.2–2.2)
Albumin: 4.5 g/dL (ref 3.8–4.9)
Alkaline Phosphatase: 66 IU/L (ref 48–121)
BUN/Creatinine Ratio: 18 (ref 9–23)
BUN: 14 mg/dL (ref 6–24)
Bilirubin Total: <0.2 mg/dL (ref 0.0–1.2)
CO2: 24 mmol/L (ref 20–29)
Calcium: 9.6 mg/dL (ref 8.7–10.2)
Chloride: 102 mmol/L (ref 96–106)
Creatinine, Ser: 0.78 mg/dL (ref 0.57–1.00)
GFR calc Af Amer: 100 mL/min/{1.73_m2} (ref >59)
GFR calc non Af Amer: 87 mL/min/{1.73_m2} (ref >59)
Globulin, Total: 2.7 g/dL (ref 1.5–4.5)
Glucose: 81 mg/dL (ref 65–99)
Potassium: 4.3 mmol/L (ref 3.5–5.2)
Sodium: 140 mmol/L (ref 134–144)
Total Protein: 7.2 g/dL (ref 6.0–8.5)

## 2019-12-14 LAB — CERVICOVAGINAL ANCILLARY ONLY
Bacterial Vaginitis (gardnerella): POSITIVE — AB
Candida Glabrata: NEGATIVE
Candida Vaginitis: POSITIVE — AB
Chlamydia: NEGATIVE
Comment: NEGATIVE
Comment: NEGATIVE
Comment: NEGATIVE
Comment: NEGATIVE
Comment: NEGATIVE
Comment: NORMAL
Neisseria Gonorrhea: NEGATIVE
Trichomonas: NEGATIVE

## 2019-12-14 LAB — CBC
Hematocrit: 38.9 % (ref 34.0–46.6)
Hemoglobin: 13 g/dL (ref 11.1–15.9)
MCH: 31.2 pg (ref 26.6–33.0)
MCHC: 33.4 g/dL (ref 31.5–35.7)
MCV: 93 fL (ref 79–97)
Platelets: 199 10*3/uL (ref 150–450)
RBC: 4.17 x10E6/uL (ref 3.77–5.28)
RDW: 12.8 % (ref 11.7–15.4)
WBC: 4.6 10*3/uL (ref 3.4–10.8)

## 2019-12-14 LAB — TSH: TSH: 1.77 u[IU]/mL (ref 0.450–4.500)

## 2019-12-14 MED ORDER — METRONIDAZOLE 500 MG PO TABS: 500.0000 mg | ORAL_TABLET | Freq: Two times a day (BID) | ORAL | 0 refills | Status: AC

## 2019-12-14 NOTE — Addendum Note (Signed)
Addended by: Verita Schneiders A on: 12/14/2019 01:57 PM   Modules accepted: Orders

## 2019-12-15 LAB — CYTOLOGY - PAP
Comment: NEGATIVE
Diagnosis: NEGATIVE
High risk HPV: NEGATIVE

## 2019-12-28 ENCOUNTER — Other Ambulatory Visit: Payer: Self-pay | Admitting: *Deleted

## 2019-12-28 MED ORDER — METRONIDAZOLE 500 MG PO TABS: 500.0000 mg | ORAL_TABLET | Freq: Two times a day (BID) | ORAL | 0 refills | Status: DC

## 2020-05-26 ENCOUNTER — Other Ambulatory Visit: Payer: Self-pay | Admitting: Obstetrics & Gynecology

## 2020-06-13 ENCOUNTER — Other Ambulatory Visit: Payer: Self-pay

## 2020-06-13 ENCOUNTER — Ambulatory Visit (INDEPENDENT_AMBULATORY_CARE_PROVIDER_SITE_OTHER): Payer: BC Managed Care – PPO

## 2020-06-13 ENCOUNTER — Other Ambulatory Visit (HOSPITAL_COMMUNITY)
Admission: RE | Admit: 2020-06-13 | Discharge: 2020-06-13 | Disposition: A | Discharge status: Home / Self Care | Payer: BC Managed Care – PPO | Source: Ambulatory Visit | Attending: Obstetrics & Gynecology | Admitting: Obstetrics & Gynecology

## 2020-06-13 VITALS — BP 118/76 | HR 84

## 2020-06-13 DIAGNOSIS — B9689 Other specified bacterial agents as the cause of diseases classified elsewhere: Secondary | ICD-10-CM

## 2020-06-13 DIAGNOSIS — N760 Acute vaginitis: Secondary | ICD-10-CM

## 2020-06-13 DIAGNOSIS — N898 Other specified noninflammatory disorders of vagina: Secondary | ICD-10-CM | POA: Diagnosis present

## 2020-06-13 NOTE — Progress Notes (Signed)
SUBJECTIVE:  55 y.o. female complains of vaginal irritation for x3 days Denies abnormal vaginal bleeding or significant pelvic pain or fever. No UTI symptoms. Denies history of known exposure to STD.  No LMP recorded (lmp unknown). Patient is postmenopausal.  OBJECTIVE:  She appears well, afebrile. Urine dipstick: not done.  ASSESSMENT:  Vaginal itching   PLAN:  GC, chlamydia, trichomonas, BVAG, CVAG probe sent to lab. Treatment: To be determined once lab results are received ROV prn if symptoms persist or worsen.

## 2020-06-13 NOTE — Progress Notes (Signed)
Patient was assessed and managed by nursing staff during this encounter. I have reviewed the chart and agree with the documentation and plan. I have also made any necessary editorial changes.  Verita Schneiders, MD 06/13/2020 10:38 AM

## 2020-06-14 LAB — CERVICOVAGINAL ANCILLARY ONLY
Bacterial Vaginitis (gardnerella): POSITIVE — AB
Candida Glabrata: NEGATIVE
Candida Vaginitis: NEGATIVE
Chlamydia: NEGATIVE
Comment: NEGATIVE
Comment: NEGATIVE
Comment: NEGATIVE
Comment: NEGATIVE
Comment: NEGATIVE
Comment: NORMAL
Neisseria Gonorrhea: NEGATIVE
Trichomonas: NEGATIVE

## 2020-06-15 MED ORDER — METRONIDAZOLE 500 MG PO TABS: 500.0000 mg | ORAL_TABLET | Freq: Two times a day (BID) | ORAL | 2 refills | Status: DC

## 2020-06-15 NOTE — Addendum Note (Signed)
Addended by: Verita Schneiders A on: 06/15/2020 11:14 AM   Modules accepted: Orders

## 2020-07-04 ENCOUNTER — Encounter: Payer: Self-pay | Admitting: Obstetrics & Gynecology

## 2020-07-04 ENCOUNTER — Ambulatory Visit (INDEPENDENT_AMBULATORY_CARE_PROVIDER_SITE_OTHER): Payer: BC Managed Care – PPO | Admitting: Obstetrics & Gynecology

## 2020-07-04 VITALS — BP 117/76 | HR 78 | Wt 177.0 lb

## 2020-07-04 DIAGNOSIS — D219 Benign neoplasm of connective and other soft tissue, unspecified: Secondary | ICD-10-CM | POA: Diagnosis not present

## 2020-07-04 DIAGNOSIS — R638 Other symptoms and signs concerning food and fluid intake: Secondary | ICD-10-CM

## 2020-07-04 NOTE — Patient Instructions (Addendum)
[facility]  Call [phone] to make an appointment. Appointments can be made directly or via referral.

## 2020-07-04 NOTE — Progress Notes (Signed)
   GYNECOLOGY OFFICE VISIT NOTE  History:   [name] is a 55 y.o. G1P1001 with history of fibroids here today for having difficulty with weight loss. Has been exercising and dieting, no success. Wants to know if her fibroids are impeding these efforts. Denies any bleeding or pain associated with fibroids, but wants surveillance scan as last one was in 03/2019.  She denies any abnormal vaginal discharge, bleeding, pelvic pain or other concerns.    Past Medical History:  Diagnosis Date  . Anemia    on iron pills  . Fibroids 06/29/2015  . Herpes     Past Surgical History:  Procedure Laterality Date  . Barbie Banner OSTEOTOMY Bilateral 11/04/2019   Procedure: PHALANX OSTEOTOMY-AKIN BILATERAL;  Surgeon: Albertine Patricia, DPM;  Location: [facility];  Service: Podiatry;  Laterality: Bilateral;  Latex LMA LOCAL  . HYSTEROSCOPY W/ ENDOMETRIAL ABLATION  07/09/2011   HTA  . Right Wrist Surgery    . WISDOM TOOTH EXTRACTION      The following portions of the patient's history were reviewed and updated as appropriate: allergies, current medications, past family history, past medical history, past social history, past surgical history and problem list.   Health Maintenance:  Normal pap and negative HRHPV on 12/13/2019.  Normal mammogram on 12/03/2019.   Review of Systems:  Pertinent items noted in HPI and remainder of comprehensive ROS otherwise negative.  Physical Exam:  BP 117/76   Pulse 78   Wt 177 lb (80.3 kg)   LMP  (LMP Unknown)   BMI 28.57 kg/m  CONSTITUTIONAL: Well-developed, well-nourished female in no acute distress.  HEENT:  Normocephalic, atraumatic. External right and left ear normal. No scleral icterus.  NECK: Normal range of motion, supple, no masses noted on observation SKIN: No rash noted. Not diaphoretic. No erythema. No pallor. MUSCULOSKELETAL: Normal range of motion. No edema noted. NEUROLOGIC: Alert and oriented to person, place, and time. Normal muscle tone  coordination. No cranial nerve deficit noted. PSYCHIATRIC: Normal mood and affect. Normal behavior. Normal judgment and thought content. CARDIOVASCULAR: Normal heart rate noted RESPIRATORY: Effort and breath sounds normal, no problems with respiration noted ABDOMEN: Enlarged fibroid uterus palpated. No other masses noted. No other overt distention noted.   PELVIC: Deferred     Assessment and Plan:      1. Fibroids Surveillance scan ordered, will follow up results and manage accordingly. - US PELVIC COMPLETE WITH TRANSVAGINAL; Future  2. Unable to lose weight Recommended referral to [facility], they are wonderful in coming up with individualized plans.  She was also given their number to call directly.  - Amb Ref to [facility]  Routine preventative health maintenance measures emphasized. Please refer to After Visit Summary for other counseling recommendations.   Return for any gynecologic concerns.    I spent 20 minutes dedicated to the care of this patient including pre-visit review of records, face to face time with the patient discussing her conditions and treatments and post visit ordering of testing.    Verita Schneiders, MD, [place] for [facility], [place]

## 2020-07-08 ENCOUNTER — Encounter (INDEPENDENT_AMBULATORY_CARE_PROVIDER_SITE_OTHER): Payer: Self-pay

## 2020-10-27 ENCOUNTER — Encounter: Payer: Self-pay | Admitting: Radiology

## 2020-11-06 ENCOUNTER — Other Ambulatory Visit: Payer: Self-pay | Admitting: Obstetrics & Gynecology

## 2020-11-06 DIAGNOSIS — Z1231 Encounter for screening mammogram for malignant neoplasm of breast: Secondary | ICD-10-CM

## 2020-12-26 ENCOUNTER — Ambulatory Visit (INDEPENDENT_AMBULATORY_CARE_PROVIDER_SITE_OTHER): Payer: BC Managed Care – PPO | Admitting: Obstetrics & Gynecology

## 2020-12-26 ENCOUNTER — Encounter: Payer: Self-pay | Admitting: Obstetrics & Gynecology

## 2020-12-26 ENCOUNTER — Other Ambulatory Visit: Payer: Self-pay

## 2020-12-26 VITALS — BP 115/73 | HR 72 | Ht 66.0 in | Wt 173.0 lb

## 2020-12-26 DIAGNOSIS — Z23 Encounter for immunization: Secondary | ICD-10-CM

## 2020-12-26 DIAGNOSIS — B009 Herpesviral infection, unspecified: Secondary | ICD-10-CM | POA: Diagnosis not present

## 2020-12-26 DIAGNOSIS — K6289 Other specified diseases of anus and rectum: Secondary | ICD-10-CM

## 2020-12-26 DIAGNOSIS — D219 Benign neoplasm of connective and other soft tissue, unspecified: Secondary | ICD-10-CM | POA: Diagnosis not present

## 2020-12-26 DIAGNOSIS — Z01419 Encounter for gynecological examination (general) (routine) without abnormal findings: Secondary | ICD-10-CM | POA: Diagnosis not present

## 2020-12-26 MED ORDER — LIDOCAINE HCL URETHRAL/MUCOSAL 2 % EX GEL
1.0000 | CUTANEOUS | 2 refills | Status: DC | PRN
Start: 2020-12-26 — End: 2023-01-08

## 2020-12-26 MED ORDER — VALACYCLOVIR HCL 1 G PO TABS: 1000.0000 mg | ORAL_TABLET | Freq: Every day | ORAL | 2 refills | Status: DC

## 2020-12-26 NOTE — Patient Instructions (Signed)
Preventive Care 40-55 Years Old, Female Preventive care refers to lifestyle choices and visits with your health care provider that can promote health and wellness. This includes: A yearly physical exam. This is also called an annual wellness visit. Regular dental and eye exams. Immunizations. Screening for certain conditions. Healthy lifestyle choices, such as: Eating a healthy diet. Getting regular exercise. Not using drugs or products that contain nicotine and tobacco. Limiting alcohol use. What can I expect for my preventive care visit? Physical exam Your health care provider will check your: Height and weight. These may be used to calculate your BMI (body mass index). BMI is a measurement that tells if you are at a healthy weight. Heart rate and blood pressure. Body temperature. Skin for abnormal spots. Counseling Your health care provider may ask you questions about your: Past medical problems. Family's medical history. Alcohol, tobacco, and drug use. Emotional well-being. Home life and relationship well-being. Sexual activity. Diet, exercise, and sleep habits. Work and work environment. Access to firearms. Method of birth control. Menstrual cycle. Pregnancy history. What immunizations do I need? Vaccines are usually given at various ages, according to a schedule. Your health care provider will recommend vaccines for you based on your age, medical history, and lifestyle or other factors, such as travel or where you work. What tests do I need? Blood tests Lipid and cholesterol levels. These may be checked every 5 years, or more often if you are over 50 years old. Hepatitis C test. Hepatitis B test. Screening Lung cancer screening. You may have this screening every year starting at age 55 if you have a 30-pack-year history of smoking and currently smoke or have quit within the past 15 years. Colorectal cancer screening. All adults should have this screening starting at  age 50 and continuing until age 75. Your health care provider may recommend screening at age 45 if you are at increased risk. You will have tests every 1-10 years, depending on your results and the type of screening test. Diabetes screening. This is done by checking your blood sugar (glucose) after you have not eaten for a while (fasting). You may have this done every 1-3 years. Mammogram. This may be done every 1-2 years. Talk with your health care provider about when you should start having regular mammograms. This may depend on whether you have a family history of breast cancer. BRCA-related cancer screening. This may be done if you have a family history of breast, ovarian, tubal, or peritoneal cancers. Pelvic exam and Pap test. This may be done every 3 years starting at age 21. Starting at age 30, this may be done every 5 years if you have a Pap test in combination with an HPV test. Other tests STD (sexually transmitted disease) testing, if you are at risk. Bone density scan. This is done to screen for osteoporosis. You may have this scan if you are at high risk for osteoporosis. Talk with your health care provider about your test results, treatment options, and if necessary, the need for more tests. Follow these instructions at home: Eating and drinking  Eat a diet that includes fresh fruits and vegetables, whole grains, lean protein, and low-fat dairy products. Take vitamin and mineral supplements as recommended by your health care provider. Do not drink alcohol if: Your health care provider tells you not to drink. You are pregnant, may be pregnant, or are planning to become pregnant. If you drink alcohol: Limit how much you have to 0-1 drink a day. Be   aware of how much alcohol is in your drink. In the [place] one drink equals one 12 oz bottle of beer (355 mL), one 5 oz glass of wine (148 mL), or one 1 oz glass of hard liquor (44 mL). Lifestyle Take daily care of your teeth and  gums. Brush your teeth every morning and night with fluoride toothpaste. Floss one time each day. Stay active. Exercise for at least 30 minutes 5 or more days each week. Do not use any products that contain nicotine or tobacco, such as cigarettes, e-cigarettes, and chewing tobacco. If you need help quitting, ask your health care provider. Do not use drugs. If you are sexually active, practice safe sex. Use a condom or other form of protection to prevent STIs (sexually transmitted infections). If you do not wish to become pregnant, use a form of birth control. If you plan to become pregnant, see your health care provider for a prepregnancy visit. If told by your health care provider, take low-dose aspirin daily starting at age 63. Find healthy ways to cope with stress, such as: Meditation, yoga, or listening to music. Journaling. Talking to a trusted person. Spending time with friends and family. Safety Always wear your seat belt while driving or riding in a vehicle. Do not drive: If you have been drinking alcohol. Do not ride with someone who has been drinking. When you are tired or distracted. While texting. Wear a helmet and other protective equipment during sports activities. If you have firearms in your house, make sure you follow all gun safety procedures. What's next? Visit your health care provider once a year for an annual wellness visit. Ask your health care provider how often you should have your eyes and teeth checked. Stay up to date on all vaccines. This information is not intended to replace advice given to you by your health care provider. Make sure you discuss any questions you have with your health care provider. Document Revised: 06/09/2020 Document Reviewed: 12/11/2017 Elsevier Patient Education  2022 [employer].

## 2020-12-26 NOTE — Progress Notes (Signed)
GYNECOLOGY ANNUAL PREVENTATIVE CARE ENCOUNTER NOTE  History:     [name] is a 55 y.o. G44P1001 female here for a routine annual gynecologic exam.  Current complaints: small painful sores around her anal regions, concerned about HSV outbreak.   Denies abnormal vaginal bleeding, discharge, pelvic pain, problems with intercourse or other gynecologic concerns.    Gynecologic History No LMP recorded (lmp unknown). Patient is postmenopausal. Contraception: post menopausal status Last Pap: 12/13/2019. Result was normal with negative HPV Last Mammogram: 12/03/2019.  Result was normal Last Colonoscopy: 10/23/2016.  Result was normal  Obstetric History OB History  Gravida Para Term Preterm AB Living  '1 1 1     1  '$ SAB IAB Ectopic Multiple Live Births          1    # Outcome Date GA Lbr Len/2nd Weight Sex Delivery Anes PTL Lv  1 Term 10/01/97    Jerilynn Mages Vag-Spont   LIV    Past Medical History:  Diagnosis Date   Anemia    on iron pills   Fibroids 06/29/2015   Herpes     Past Surgical History:  Procedure Laterality Date   Barbie Banner OSTEOTOMY Bilateral 11/04/2019   Procedure: PHALANX OSTEOTOMY-AKIN BILATERAL;  Surgeon: Albertine Patricia, DPM;  Location: [place];  Service: Podiatry;  Laterality: Bilateral;  Latex LMA LOCAL   HYSTEROSCOPY W/ ENDOMETRIAL ABLATION  07/09/2011   HTA   Right Wrist Surgery     WISDOM TOOTH EXTRACTION      Current Outpatient Medications on File Prior to Visit  Medication Sig Dispense Refill   cetirizine (ZYRTEC) 10 MG tablet Take 10 mg by mouth daily.     clobetasol ointment (TEMOVATE) [id number] % Apply 1 application topically 2 (two) times daily as needed.     ferrous sulfate 325 (65 FE) MG tablet TAKE 1 TABLET (325 MG TOTAL) BY MOUTH 2 (TWO) TIMES DAILY WITH A MEAL. [id number] tablet 2   folic acid (FOLVITE) 1 MG tablet Take 1 mg by mouth daily.     methotrexate (RHEUMATREX) 2.5 MG tablet Take 2.5 mg by mouth once a week. Reported on 07/21/2015      triamcinolone cream (KENALOG) 0.1 % Apply topically. Reported on 09/20/2015     [DISCONTINUED] medroxyPROGESTERone (PROVERA) 10 MG tablet Take 2 tablets (20 mg total) by mouth daily. 30 tablet 2   No current facility-administered medications on file prior to visit.    Allergies  Allergen Reactions   Latex Hives    By test   Peanut-Containing Drug Products Rash    All nuts    Social History:  reports that she has never smoked. She has never used smokeless tobacco. She reports that she does not drink alcohol and does not use drugs.  Family History  Problem Relation Age of Onset   Cancer Mother 85       ovarian   Breast cancer Mother    Cancer Paternal Grandmother        BREAST   Breast cancer Maternal Grandmother     The following portions of the patient's history were reviewed and updated as appropriate: allergies, current medications, past family history, past medical history, past social history, past surgical history and problem list.  Review of Systems Pertinent items noted in HPI and remainder of comprehensive ROS otherwise negative.  Physical Exam:  BP 115/73   Pulse 72   Ht '5\' 6"'$  (1.676 m)   Wt 173 lb (78.5 kg)   LMP  (  LMP Unknown)   BMI 27.92 kg/m  CONSTITUTIONAL: Well-developed, well-nourished female in no acute distress.  HENT:  Normocephalic, atraumatic, External right and left ear normal.  EYES: Conjunctivae and EOM are normal. Pupils are equal, round, and reactive to light. No scleral icterus.  NECK: Normal range of motion, supple, no masses.  Normal thyroid.  SKIN: Skin is warm and dry. No rash noted. Not diaphoretic. No erythema. No pallor. MUSCULOSKELETAL: Normal range of motion. No tenderness.  No cyanosis, clubbing, or edema. NEUROLOGIC: Alert and oriented to person, place, and time. Normal reflexes, muscle tone coordination.  PSYCHIATRIC: Normal mood and affect. Normal behavior. Normal judgment and thought content. CARDIOVASCULAR: Normal heart rate  noted, regular rhythm RESPIRATORY: Clear to auscultation bilaterally. Effort and breath sounds normal, no problems with respiration noted. BREASTS: Symmetric in size. No masses, tenderness, skin changes, nipple drainage, or lymphadenopathy bilaterally. Performed in the presence of a chaperone. ABDOMEN: Soft, enlarged fibroid uterus palpated. No distention noted.  No tenderness, rebound or guarding.  PELVIC: Normal appearing external genitalia and urethral meatus; normal appearing vaginal mucosa and cervix.  No abnormal vaginal discharge noted.  Three small and erythematous ulcerated lesions seen in perianal region.  Bimanual exam deferred.  Performed in the presence of a chaperone.   Assessment and Plan:     1. 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 Told to return for worsening symptoms.  - valACYclovir (VALTREX) 1000 MG tablet; Take 1 tablet (1,000 mg total) by mouth daily. Take for five days for any episode.  Dispense: 30 tablet; Refill: 2 - lidocaine (XYLOCAINE) 2 % jelly; Apply 1 application topically as needed.  Dispense: 30 mL; Refill: 2  3. Fibroids Stable, no issues currently.  4. Flu vaccine need - Flu Vaccine QUAD 36+ mos IM (Fluarix, Quad PF) given  5. Well woman exam with routine gynecological exam Desires preventative health maintenance labs. - CBC - Comprehensive metabolic panel - Lipid panel - Hemoglobin A1c - TSH Up to date on pap smear, mammogram already scheduled for this month. Colon cancer screening is up to date. Routine preventative health maintenance measures emphasized. Please refer to After Visit Summary for other counseling recommendations.      Verita Schneiders, MD, [place] for [employer], [place]

## 2020-12-27 LAB — COMPREHENSIVE METABOLIC PANEL WITH GFR
ALT: 17 IU/L (ref 0–32)
AST: 27 IU/L (ref 0–40)
Albumin/Globulin Ratio: 1.7 (ref 1.2–2.2)
Albumin: 4.5 g/dL (ref 3.8–4.9)
Alkaline Phosphatase: 56 IU/L (ref 44–121)
BUN/Creatinine Ratio: 19 (ref 9–23)
BUN: 13 mg/dL (ref 6–24)
Bilirubin Total: 0.4 mg/dL (ref 0.0–1.2)
CO2: 25 mmol/L (ref 20–29)
Calcium: 9.8 mg/dL (ref 8.7–10.2)
Chloride: 99 mmol/L (ref 96–106)
Creatinine, Ser: 0.7 mg/dL (ref 0.57–1.00)
Globulin, Total: 2.7 g/dL (ref 1.5–4.5)
Glucose: 83 mg/dL (ref 65–99)
Potassium: 4.2 mmol/L (ref 3.5–5.2)
Sodium: 137 mmol/L (ref 134–144)
Total Protein: 7.2 g/dL (ref 6.0–8.5)
eGFR: 103 mL/min/1.73 (ref >59)

## 2020-12-27 LAB — HEMOGLOBIN A1C
Est. average glucose Bld gHb Est-mCnc: 117 mg/dL
Hgb A1c MFr Bld: 5.7 % — ABNORMAL HIGH (ref 4.8–5.6)

## 2020-12-27 LAB — CBC
Hematocrit: 36.9 % (ref 34.0–46.6)
Hemoglobin: 12.4 g/dL (ref 11.1–15.9)
MCH: 30.1 pg (ref 26.6–33.0)
MCHC: 33.6 g/dL (ref 31.5–35.7)
MCV: 90 fL (ref 79–97)
Platelets: 223 x10E3/uL (ref 150–450)
RBC: 4.12 x10E6/uL (ref 3.77–5.28)
RDW: 12.3 % (ref 11.7–15.4)
WBC: 4 x10E3/uL (ref 3.4–10.8)

## 2020-12-27 LAB — LIPID PANEL
Chol/HDL Ratio: 2.4 ratio (ref 0.0–4.4)
Cholesterol, Total: 179 mg/dL (ref 100–199)
HDL: 75 mg/dL (ref >39)
LDL Chol Calc (NIH): 95 mg/dL (ref 0–99)
Triglycerides: 43 mg/dL (ref 0–149)
VLDL Cholesterol Cal: 9 mg/dL (ref 5–40)

## 2020-12-27 LAB — TSH: TSH: 2.09 u[IU]/mL (ref 0.450–4.500)

## 2020-12-28 ENCOUNTER — Other Ambulatory Visit: Payer: Self-pay

## 2020-12-28 ENCOUNTER — Ambulatory Visit
Admission: RE | Admit: 2020-12-28 | Discharge: 2020-12-28 | Disposition: A | Discharge status: Home / Self Care | Payer: BC Managed Care – PPO | Source: Ambulatory Visit | Attending: Obstetrics & Gynecology | Admitting: Obstetrics & Gynecology

## 2020-12-28 DIAGNOSIS — Z1231 Encounter for screening mammogram for malignant neoplasm of breast: Secondary | ICD-10-CM

## 2021-02-01 ENCOUNTER — Other Ambulatory Visit: Payer: Self-pay | Admitting: Obstetrics & Gynecology

## 2021-11-06 ENCOUNTER — Ambulatory Visit: Payer: PRIVATE HEALTH INSURANCE | Admitting: Obstetrics & Gynecology

## 2021-11-06 ENCOUNTER — Ambulatory Visit (INDEPENDENT_AMBULATORY_CARE_PROVIDER_SITE_OTHER): Payer: BC Managed Care – PPO | Admitting: Obstetrics & Gynecology

## 2021-11-06 ENCOUNTER — Other Ambulatory Visit (HOSPITAL_COMMUNITY)
Admission: RE | Admit: 2021-11-06 | Discharge: 2021-11-06 | Disposition: A | Discharge status: Home / Self Care | Payer: BC Managed Care – PPO | Source: Ambulatory Visit | Attending: Obstetrics & Gynecology | Admitting: Obstetrics & Gynecology

## 2021-11-06 ENCOUNTER — Encounter: Payer: Self-pay | Admitting: Obstetrics & Gynecology

## 2021-11-06 VITALS — BP 125/75 | HR 80 | Wt 164.0 lb

## 2021-11-06 DIAGNOSIS — N951 Menopausal and female climacteric states: Secondary | ICD-10-CM

## 2021-11-06 DIAGNOSIS — Z0000 Encounter for general adult medical examination without abnormal findings: Secondary | ICD-10-CM

## 2021-11-06 DIAGNOSIS — N898 Other specified noninflammatory disorders of vagina: Secondary | ICD-10-CM

## 2021-11-06 DIAGNOSIS — B9689 Other specified bacterial agents as the cause of diseases classified elsewhere: Secondary | ICD-10-CM

## 2021-11-06 DIAGNOSIS — Z113 Encounter for screening for infections with a predominantly sexual mode of transmission: Secondary | ICD-10-CM | POA: Insufficient documentation

## 2021-11-06 DIAGNOSIS — N760 Acute vaginitis: Secondary | ICD-10-CM

## 2021-11-06 MED ORDER — ESTRADIOL 0.1 MG/GM VA CREA: TOPICAL_CREAM | VAGINAL | 12 refills | Status: AC

## 2021-11-06 NOTE — Progress Notes (Signed)
GYNECOLOGY OFFICE VISIT NOTE  History:   [name] is a 56 y.o. G1P1001 here today for management of menopausal vaginal dryness.  She has tried [place] in past, and this helped but was too expensive. Wants to try vaginal estrogen therapy.  Also reports vaginal discharge and odor for a few days, no irritation but wants evaluation.  Also desires comprehensive STI screen and annual preventative healthcare maintenance labs.  She denies any abnormal vaginal bleeding, pelvic pain or other concerns.    Past Medical History:  Diagnosis Date   Anemia    on iron pills   Fibroids 06/29/2015   Herpes     Past Surgical History:  Procedure Laterality Date   Barbie Banner OSTEOTOMY Bilateral 11/04/2019   Procedure: PHALANX OSTEOTOMY-AKIN BILATERAL;  Surgeon: Albertine Patricia, DPM;  Location: [place];  Service: Podiatry;  Laterality: Bilateral;  Latex LMA LOCAL   HYSTEROSCOPY W/ ENDOMETRIAL ABLATION  07/09/2011   HTA   Right Wrist Surgery     WISDOM TOOTH EXTRACTION      The following portions of the patient's history were reviewed and updated as appropriate: allergies, current medications, past family history, past medical history, past social history, past surgical history and problem list.   Health Maintenance:  Normal pap and negative HRHPV on 12/13/2019.  Normal mammogram on 12/28/2020.  Benign colonoscopy on 10/23/2016.  Review of Systems:  Pertinent items noted in HPI and remainder of comprehensive ROS otherwise negative.  Physical Exam:  BP 125/75   Pulse 80   Wt 164 lb (74.4 kg)   LMP  (LMP Unknown)   BMI 26.47 kg/m  CONSTITUTIONAL: Well-developed, well-nourished female in no acute distress.  HEENT:  Normocephalic, atraumatic. External right and left ear normal. No scleral icterus.  NECK: Normal range of motion, supple, no masses noted on observation SKIN: No rash noted. Not diaphoretic. No erythema. No pallor. MUSCULOSKELETAL: Normal range of motion. No edema  noted. NEUROLOGIC: Alert and oriented to person, place, and time. Normal muscle tone coordination. No cranial nerve deficit noted. PSYCHIATRIC: Normal mood and affect. Normal behavior. Normal judgment and thought content. CARDIOVASCULAR: Normal heart rate noted RESPIRATORY: Effort and breath sounds normal, no problems with respiration noted ABDOMEN: No masses noted. No other overt distention noted.   PELVIC: Normal appearing external genitalia with mild atrophy, normal urethral meatus; mild atrophy of the distal vaginal mucosa.  Clear discharge noted, testing sample obtained.  Performed in the presence of a chaperone    Assessment and Plan:     1. Menopausal vaginal dryness Will do trial of Estrace, samples of Imvexxy (ten 10 mcg estradiol vaginal inserts) given to her to replace the first ten days of prescribed therapy. Also recommended vaginal hyaluronic acid moisturizer (Hyalo-GYN). Will re-evaluate in one month.  - estradiol (ESTRACE) 0.1 MG/GM vaginal cream; Apply 1 gram per vagina every night for 2 weeks, then apply three times a week  Dispense: 30 g; Refill: 12  2. Vaginal discharge - Cervicovaginal ancillary only done, will follow up results and manage accordingly.  3. Routine screening for STI (sexually transmitted infection) STI screen done, will follow up results and manage accordingly. - RPR+HBsAg+HCVAb+HIV - Cervicovaginal ancillary only  4. Preventative health care Labs checked (patient was fasting), will follow up results and manage accordingly. - Hemoglobin A1c - TSH Rfx on Abnormal to Free T4 - CBC - Comprehensive metabolic panel - Lipid panel Routine preventative health maintenance measures emphasized. Please refer to After Visit Summary for other counseling recommendations.  Return in 1 month (on 12/07/2021) for Followup vaginal estrogen therapy.    I spent 30 minutes dedicated to the care of this patient including pre-visit review of records, face to face time  with the patient discussing her conditions and treatments and post visit orders.    Verita Schneiders, MD, [place] for [employer], [place]

## 2021-11-06 NOTE — Patient Instructions (Addendum)
HYALO GYN is a personal lubricant intended to moisturize and lubricate to enhance the ease and comfort of intimate sexual activity and supplement the body's natural lubrication. It is available in a colorless, odorless, transparent, aqueous, hydrating gel and a moisturizing suppository, both hormone-free and made without parabens. HYALO GYN acts as a moisturizer and lubricant because of the strong hydrating properties of its proprietary hyaluronic acid derivative component, Hydeal-D.

## 2021-11-07 LAB — CERVICOVAGINAL ANCILLARY ONLY
Bacterial Vaginitis (gardnerella): POSITIVE — AB
Candida Glabrata: NEGATIVE
Candida Vaginitis: NEGATIVE
Chlamydia: NEGATIVE
Comment: NEGATIVE
Comment: NEGATIVE
Comment: NEGATIVE
Comment: NEGATIVE
Comment: NEGATIVE
Comment: NORMAL
Neisseria Gonorrhea: NEGATIVE
Trichomonas: NEGATIVE

## 2021-11-07 LAB — CBC
Hematocrit: 35.2 % (ref 34.0–46.6)
Hemoglobin: 11.7 g/dL (ref 11.1–15.9)
MCH: 30.5 pg (ref 26.6–33.0)
MCHC: 33.2 g/dL (ref 31.5–35.7)
MCV: 92 fL (ref 79–97)
Platelets: 227 10*3/uL (ref 150–450)
RBC: 3.84 x10E6/uL (ref 3.77–5.28)
RDW: 12.9 % (ref 11.7–15.4)
WBC: 3.7 10*3/uL (ref 3.4–10.8)

## 2021-11-07 LAB — TSH RFX ON ABNORMAL TO FREE T4: TSH: 1.38 u[IU]/mL (ref 0.450–4.500)

## 2021-11-07 LAB — COMPREHENSIVE METABOLIC PANEL
ALT: 14 IU/L (ref 0–32)
AST: 22 IU/L (ref 0–40)
Albumin/Globulin Ratio: 1.8 (ref 1.2–2.2)
Albumin: 4.5 g/dL (ref 3.8–4.9)
Alkaline Phosphatase: 63 IU/L (ref 44–121)
BUN/Creatinine Ratio: 18 (ref 9–23)
BUN: 14 mg/dL (ref 6–24)
Bilirubin Total: 0.4 mg/dL (ref 0.0–1.2)
CO2: 23 mmol/L (ref 20–29)
Calcium: 9.9 mg/dL (ref 8.7–10.2)
Chloride: 103 mmol/L (ref 96–106)
Creatinine, Ser: 0.8 mg/dL (ref 0.57–1.00)
Globulin, Total: 2.5 g/dL (ref 1.5–4.5)
Glucose: 89 mg/dL (ref 70–99)
Potassium: 4 mmol/L (ref 3.5–5.2)
Sodium: 140 mmol/L (ref 134–144)
Total Protein: 7 g/dL (ref 6.0–8.5)
eGFR: 87 mL/min/{1.73_m2} (ref >59)

## 2021-11-07 LAB — LIPID PANEL
Chol/HDL Ratio: 2.3 ratio (ref 0.0–4.4)
Cholesterol, Total: 181 mg/dL (ref 100–199)
HDL: 78 mg/dL (ref >39)
LDL Chol Calc (NIH): 94 mg/dL (ref 0–99)
Triglycerides: 44 mg/dL (ref 0–149)
VLDL Cholesterol Cal: 9 mg/dL (ref 5–40)

## 2021-11-07 LAB — RPR+HBSAG+HCVAB+...
HIV Screen 4th Generation wRfx: NONREACTIVE
Hep C Virus Ab: NONREACTIVE
Hepatitis B Surface Ag: NEGATIVE
RPR Ser Ql: NONREACTIVE

## 2021-11-07 LAB — HEMOGLOBIN A1C
Est. average glucose Bld gHb Est-mCnc: 126 mg/dL
Hgb A1c MFr Bld: 6 % — ABNORMAL HIGH (ref 4.8–5.6)

## 2021-11-07 MED ORDER — METRONIDAZOLE 500 MG PO TABS: 500.0000 mg | ORAL_TABLET | Freq: Two times a day (BID) | ORAL | 0 refills | Status: AC

## 2021-11-07 NOTE — Addendum Note (Signed)
Addended by: Verita Schneiders A on: 11/07/2021 02:51 PM   Modules accepted: Orders

## 2021-11-21 ENCOUNTER — Encounter (INDEPENDENT_AMBULATORY_CARE_PROVIDER_SITE_OTHER): Payer: Self-pay

## 2021-11-28 ENCOUNTER — Other Ambulatory Visit: Payer: Self-pay | Admitting: Obstetrics & Gynecology

## 2021-11-28 DIAGNOSIS — Z1231 Encounter for screening mammogram for malignant neoplasm of breast: Secondary | ICD-10-CM

## 2021-12-06 ENCOUNTER — Encounter: Payer: Self-pay | Admitting: Obstetrics & Gynecology

## 2021-12-06 ENCOUNTER — Other Ambulatory Visit: Payer: Self-pay | Admitting: *Deleted

## 2021-12-06 DIAGNOSIS — K6289 Other specified diseases of anus and rectum: Secondary | ICD-10-CM

## 2021-12-06 DIAGNOSIS — B009 Herpesviral infection, unspecified: Secondary | ICD-10-CM

## 2021-12-06 MED ORDER — VALACYCLOVIR HCL 1 G PO TABS: 1000.0000 mg | ORAL_TABLET | Freq: Every day | ORAL | 3 refills | Status: DC

## 2021-12-09 ENCOUNTER — Other Ambulatory Visit: Payer: Self-pay | Admitting: Obstetrics & Gynecology

## 2021-12-09 DIAGNOSIS — K6289 Other specified diseases of anus and rectum: Secondary | ICD-10-CM

## 2021-12-09 DIAGNOSIS — B009 Herpesviral infection, unspecified: Secondary | ICD-10-CM

## 2021-12-25 ENCOUNTER — Ambulatory Visit: Payer: PRIVATE HEALTH INSURANCE | Admitting: Obstetrics & Gynecology

## 2021-12-31 ENCOUNTER — Ambulatory Visit
Admission: RE | Admit: 2021-12-31 | Discharge: 2021-12-31 | Disposition: A | Discharge status: Home / Self Care | Payer: PRIVATE HEALTH INSURANCE | Source: Ambulatory Visit | Attending: Obstetrics & Gynecology | Admitting: Obstetrics & Gynecology

## 2021-12-31 DIAGNOSIS — Z1231 Encounter for screening mammogram for malignant neoplasm of breast: Secondary | ICD-10-CM

## 2022-01-03 ENCOUNTER — Ambulatory Visit: Payer: PRIVATE HEALTH INSURANCE | Admitting: Advanced Practice Midwife

## 2022-01-08 ENCOUNTER — Other Ambulatory Visit: Payer: Self-pay | Admitting: Obstetrics & Gynecology

## 2022-01-24 ENCOUNTER — Encounter: Payer: Self-pay | Admitting: Obstetrics & Gynecology

## 2022-01-24 ENCOUNTER — Ambulatory Visit (INDEPENDENT_AMBULATORY_CARE_PROVIDER_SITE_OTHER): Payer: BC Managed Care – PPO | Admitting: Obstetrics & Gynecology

## 2022-01-24 VITALS — BP 137/80 | HR 76 | Wt 160.0 lb

## 2022-01-24 DIAGNOSIS — N951 Menopausal and female climacteric states: Secondary | ICD-10-CM

## 2022-01-24 NOTE — Patient Instructions (Addendum)
Look up HYALO-GYN to use in combination with the estrogen therapy

## 2022-01-24 NOTE — Progress Notes (Signed)
   GYNECOLOGY OFFICE VISIT NOTE  History:   [name] is a 56 y.o. G1P1001 here today for follow up after staring estrogen topical therapy for vaginal dryness after her visit on 11/06/2021.  Reports improvement in symptoms. She denies any abnormal vaginal discharge, bleeding, pelvic pain or other concerns.    Past Medical History:  Diagnosis Date   Anemia    on iron pills   Fibroids 06/29/2015   Herpes     Past Surgical History:  Procedure Laterality Date   Barbie Banner OSTEOTOMY Bilateral 11/04/2019   Procedure: PHALANX OSTEOTOMY-AKIN BILATERAL;  Surgeon: Albertine Patricia, DPM;  Location: [place];  Service: Podiatry;  Laterality: Bilateral;  Latex LMA LOCAL   HYSTEROSCOPY W/ ENDOMETRIAL ABLATION  07/09/2011   HTA   Right Wrist Surgery     WISDOM TOOTH EXTRACTION      The following portions of the patient's history were reviewed and updated as appropriate: allergies, current medications, past family history, past medical history, past social history, past surgical history and problem list.   Health Maintenance:  Normal pap and negative HRHPV on 12/13/2019.  Normal mammogram on 01/01/2022.  Benign colonoscopy on 10/23/2016.   Review of Systems:  Pertinent items noted in HPI and remainder of comprehensive ROS otherwise negative.  Physical Exam:  BP 137/80   Pulse 76   Wt 160 lb (72.6 kg)   LMP  (LMP Unknown)   BMI 25.82 kg/m  CONSTITUTIONAL: Well-developed, well-nourished female in no acute distress.  SKIN: No rash noted. Not diaphoretic. No erythema. No pallor. MUSCULOSKELETAL: Normal range of motion. No edema noted. NEUROLOGIC: Alert and oriented to person, place, and time. Normal muscle tone coordination. No cranial nerve deficit noted. PSYCHIATRIC: Normal mood and affect. Normal behavior. Normal judgment and thought content. CARDIOVASCULAR: Normal heart rate noted RESPIRATORY: Effort and breath sounds normal, no problems with respiration noted ABDOMEN: No masses  noted. No other overt distention noted.   PELVIC: Deferred     Assessment and Plan:     1. Menopausal vaginal dryness Improved symptoms, continue estrogen therapy as prescribed. Also recommended concomitant use of Hyalo GYN as a vaginal moisturizer/lubricant.   Return for any gynecologic concerns.    I spent 20 minutes dedicated to the care of this patient including pre-visit review of records, face to face time with the patient discussing her conditions and treatments and post visit orders.    Verita Schneiders, MD, [place] for [employer], [place]

## 2022-03-21 ENCOUNTER — Encounter: Payer: Self-pay | Admitting: Obstetrics & Gynecology

## 2022-03-21 DIAGNOSIS — N760 Acute vaginitis: Secondary | ICD-10-CM

## 2022-03-25 MED ORDER — METRONIDAZOLE 500 MG PO TABS: 500.0000 mg | ORAL_TABLET | Freq: Two times a day (BID) | ORAL | 0 refills | Status: AC

## 2022-03-25 MED ORDER — FLUCONAZOLE 150 MG PO TABS: 150.0000 mg | ORAL_TABLET | Freq: Once | ORAL | 3 refills | Status: DC

## 2022-10-23 ENCOUNTER — Other Ambulatory Visit: Payer: Self-pay | Admitting: Obstetrics & Gynecology

## 2022-10-23 DIAGNOSIS — N760 Acute vaginitis: Secondary | ICD-10-CM

## 2022-12-03 ENCOUNTER — Other Ambulatory Visit: Payer: Self-pay | Admitting: Obstetrics & Gynecology

## 2022-12-03 DIAGNOSIS — Z1231 Encounter for screening mammogram for malignant neoplasm of breast: Secondary | ICD-10-CM

## 2022-12-20 ENCOUNTER — Other Ambulatory Visit: Payer: Self-pay | Admitting: Obstetrics & Gynecology

## 2023-01-02 ENCOUNTER — Ambulatory Visit
Admission: RE | Admit: 2023-01-02 | Discharge: 2023-01-02 | Disposition: A | Discharge status: Home / Self Care | Payer: BC Managed Care – PPO | Source: Ambulatory Visit | Attending: Obstetrics & Gynecology | Admitting: Obstetrics & Gynecology

## 2023-01-02 DIAGNOSIS — Z1231 Encounter for screening mammogram for malignant neoplasm of breast: Secondary | ICD-10-CM

## 2023-01-07 ENCOUNTER — Encounter: Payer: Self-pay | Admitting: Obstetrics & Gynecology

## 2023-01-08 ENCOUNTER — Other Ambulatory Visit: Payer: Self-pay

## 2023-01-08 ENCOUNTER — Other Ambulatory Visit: Payer: Self-pay | Admitting: Obstetrics & Gynecology

## 2023-01-08 DIAGNOSIS — B009 Herpesviral infection, unspecified: Secondary | ICD-10-CM

## 2023-01-08 DIAGNOSIS — K6289 Other specified diseases of anus and rectum: Secondary | ICD-10-CM

## 2023-01-08 MED ORDER — LIDOCAINE HCL URETHRAL/MUCOSAL 2 % EX GEL: 1.0000 | CUTANEOUS | 2 refills | Status: DC | PRN

## 2023-01-08 MED ORDER — LIDOCAINE 5 % EX OINT
1.0000 | TOPICAL_OINTMENT | CUTANEOUS | 0 refills | Status: DC | PRN
Start: 2023-01-08 — End: 2023-02-11

## 2023-01-08 NOTE — Progress Notes (Signed)
Not able to send Rx electronically will have to get provider to send Rx.

## 2023-02-08 ENCOUNTER — Other Ambulatory Visit: Payer: Self-pay | Admitting: Obstetrics & Gynecology

## 2023-02-08 DIAGNOSIS — K6289 Other specified diseases of anus and rectum: Secondary | ICD-10-CM

## 2023-02-08 DIAGNOSIS — B009 Herpesviral infection, unspecified: Secondary | ICD-10-CM

## 2023-03-12 ENCOUNTER — Other Ambulatory Visit: Payer: Self-pay | Admitting: Obstetrics & Gynecology

## 2023-03-12 DIAGNOSIS — B009 Herpesviral infection, unspecified: Secondary | ICD-10-CM

## 2023-03-12 DIAGNOSIS — K6289 Other specified diseases of anus and rectum: Secondary | ICD-10-CM

## 2023-03-24 ENCOUNTER — Other Ambulatory Visit: Payer: Self-pay | Admitting: *Deleted

## 2023-03-24 DIAGNOSIS — B009 Herpesviral infection, unspecified: Secondary | ICD-10-CM

## 2023-03-24 DIAGNOSIS — K6289 Other specified diseases of anus and rectum: Secondary | ICD-10-CM

## 2023-03-24 MED ORDER — VALACYCLOVIR HCL 1 G PO TABS
1000.0000 mg | ORAL_TABLET | Freq: Every day | ORAL | 3 refills | Status: DC
Start: 2023-03-24 — End: 2023-11-21

## 2023-03-24 NOTE — Progress Notes (Signed)
Previous E-Scibe refill failed

## 2023-07-03 ENCOUNTER — Encounter: Payer: Self-pay | Admitting: Obstetrics & Gynecology

## 2023-07-03 ENCOUNTER — Ambulatory Visit: Payer: Self-pay | Admitting: Obstetrics & Gynecology

## 2023-07-03 ENCOUNTER — Other Ambulatory Visit (HOSPITAL_COMMUNITY)
Admission: RE | Admit: 2023-07-03 | Discharge: 2023-07-03 | Disposition: A | Discharge status: Home / Self Care | Source: Ambulatory Visit | Attending: Obstetrics & Gynecology | Admitting: Obstetrics & Gynecology

## 2023-07-03 VITALS — BP 100/65 | HR 82 | Ht 67.0 in | Wt 167.0 lb

## 2023-07-03 DIAGNOSIS — Z1339 Encounter for screening examination for other mental health and behavioral disorders: Secondary | ICD-10-CM | POA: Diagnosis not present

## 2023-07-03 DIAGNOSIS — Z01419 Encounter for gynecological examination (general) (routine) without abnormal findings: Secondary | ICD-10-CM | POA: Insufficient documentation

## 2023-07-03 DIAGNOSIS — Z113 Encounter for screening for infections with a predominantly sexual mode of transmission: Secondary | ICD-10-CM

## 2023-07-03 NOTE — Progress Notes (Signed)
 Patient presents for Annual.   Last pap:  12/13/19 WNL  Contraception: Post-menopausal Mammogram: Up to date: 01/02/23 STD Screening: Accepts FULL PANEL    CC:  upper discomfort in upper chest.  Pt consents to Female student in exam rm.

## 2023-07-03 NOTE — Progress Notes (Signed)
 GYNECOLOGY ANNUAL PREVENTATIVE CARE ENCOUNTER NOTE  History:     [name] is a 58 y.o. G27P1001 female with a history of uterine fibroids here for a routine annual gynecologic exam.  Current complaints: no significant complaints.   Denies abnormal vaginal bleeding, discharge, pelvic pain, problems with intercourse or other gynecologic concerns.   Of note, permission was obtained from patient to have a medical student present during this encounter and to participate in the physical examination.  Gynecologic History No LMP recorded (lmp unknown). Patient is postmenopausal. Contraception: none Last Pap: 12/13/2019. Result was normal with negative HPV Last Mammogram: 01/02/2023.  Result was normal Last Colonoscopy: 10/23/2021.  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 Benign pathology.        Postmenopausal bleeding 05/14/2018    Past Surgical History:  Procedure Laterality Date   Quintella Reichert OSTEOTOMY Bilateral 11/04/2019   Procedure: PHALANX OSTEOTOMY-AKIN BILATERAL;  Surgeon: Recardo Evangelist, DPM;  [facility];  Service: Podiatry;  Laterality: Bilateral;  Latex LMA LOCAL   HYSTEROSCOPY W/ ENDOMETRIAL ABLATION  07/09/2011   HTA   Right Wrist Surgery     WISDOM TOOTH EXTRACTION      Current Outpatient Medications on File Prior to Visit  Medication Sig Dispense Refill   cetirizine (ZYRTEC) 10 MG tablet Take 10 mg by mouth daily.     clobetasol ointment (TEMOVATE) 0.05 % Apply 1  application topically 2 (two) times daily as needed.     ferrous sulfate 325 (65 FE) MG tablet TAKE 1 TABLET BY MOUTH TWICE A DAY WITH FOOD 180 tablet 2   folic acid (FOLVITE) 1 MG tablet Take 1 mg by mouth daily.     methotrexate (RHEUMATREX) 2.5 MG tablet Take 2.5 mg by mouth once a week. Reported on 07/21/2015     triamcinolone cream (KENALOG) 0.1 % Apply topically. Reported on 09/20/2015     valACYclovir (VALTREX) 1000 MG tablet Take 1 tablet (1,000 mg total) by mouth daily. Take for five days for any episode. 90 tablet 3   estradiol (ESTRACE) 0.1 MG/GM vaginal cream Apply 1 gram per vagina every night for 2 weeks, then apply three times a week (Patient not taking: Reported on 07/03/2023) 30 g 12   fluconazole (DIFLUCAN) 150 MG tablet TAKE 1 TABLET (150 MG TOTAL) BY MOUTH ONCE FOR 1 DOSE. MAY REPEAT 3 DAYS LATER IF SYMPTOMS PERSIST (Patient not taking: Reported on 07/03/2023) 1 tablet 3   lidocaine (XYLOCAINE) 5 % ointment APPLY 1 APPLICATION TOPICALLY AS NEEDED FOR MILD PAIN OR MODERATE PAIN. 35.44 g 5   [DISCONTINUED] medroxyPROGESTERone (PROVERA) 10 MG tablet Take 2 tablets (20 mg total) by mouth daily. 30 tablet 2   No current facility-administered medications on file prior to visit.    Allergies  Allergen Reactions   Latex Hives    By test   Peanut-Containing Drug Products  Rash    All nuts    Social History:  reports that she has never smoked. She has never used smokeless tobacco. She reports that she does not drink alcohol and does not use drugs.  Family History  Problem Relation Age of Onset   Cancer Mother 5       ovarian   Breast cancer Maternal Grandmother    Cancer Paternal Grandmother        BREAST    The following portions of the patient's history were reviewed and updated as appropriate: allergies, current medications, past family history, past medical history, past social history, past surgical history and problem list.  Review of Systems Pertinent items noted in HPI  and remainder of comprehensive ROS otherwise negative.  Physical Exam:  BP 100/65   Pulse 82   Ht 5\' 7"  (1.702 m)   Wt 167 lb (75.8 kg)   LMP  (LMP Unknown)   BMI 26.16 kg/m  CONSTITUTIONAL: Well-developed, well-nourished female in no acute distress.  HENT:  Normocephalic, atraumatic, External right and left ear normal.  EYES: Conjunctivae and EOM are normal. Pupils are equal, round, and reactive to light. No scleral icterus.  SKIN: Skin is warm and dry. No rash noted. Not diaphoretic. No erythema. No pallor. MUSCULOSKELETAL: Normal range of motion. No tenderness.  No cyanosis, clubbing, or edema. NEUROLOGIC: Alert and oriented to person, place, and time. Normal reflexes, muscle tone coordination.  PSYCHIATRIC: Normal mood and affect. Normal behavior. Normal judgment and thought content. CARDIOVASCULAR: Normal heart rate noted, regular rhythm RESPIRATORY: Clear to auscultation bilaterally. Effort and breath sounds normal, no problems with respiration noted. BREASTS: Symmetric in size. No masses, tenderness, skin changes, nipple drainage, or lymphadenopathy bilaterally. Performed in the presence of a chaperone. ABDOMEN: Soft, no distention noted.  No tenderness, rebound or guarding.  PELVIC: Normal appearing external genitalia and urethral meatus; normal appearing vaginal mucosa and cervix.  No abnormal vaginal discharge noted.  Pap smear obtained.  Normal uterine size, no other palpable masses, no uterine or adnexal tenderness.  Performed by the medical student in the presence of a chaperone and under the supervision of the attending physician.   Assessment and Plan:    1. Routine screening for STI (sexually transmitted infection) We will follow up with this patient pending results of her study. - Cytology - PAP - RPR+HBsAg+HCVAb+HIV  2. Well woman exam with routine gynecological exam (Primary) We will inform the patients of the results of her screening tests and labs. - Cytology -  PAP - Hemoglobin A1c - Lipid panel - TSH Rfx on Abnormal to Free T4 Will follow up results of pap smear and manage accordingly. Normal breast examination today, she was advised to perform periodic self breast examinations.  Mammogram and  colon cancer screening up to date. Routine preventative health maintenance measures emphasized. Please refer to After Visit Summary for other counseling recommendations.     Ophelia Charter Sheilah Mins [facility] Medical Student    Attestation of Attending Supervision of Student:  I confirm that I have verified the information documented in the medical student's note and that I have also personally co-performed the history, physical exam and all medical decision making activities.  I have verified that all services and findings are accurately documented in this student's note; and I agree with management and plan as outlined in the documentation. I have also made any necessary editorial changes.   Jaynie Collins, MD, FACOG Attending Obstetrician & Gynecologist, [facility] for [facility]  Healthcare, [facility]

## 2023-07-08 LAB — LIPID PANEL
Chol/HDL Ratio: 2.4 ratio (ref 0.0–4.4)
Cholesterol, Total: 193 mg/dL (ref 100–199)
HDL: 80 mg/dL (ref >39)
LDL Chol Calc (NIH): 105 mg/dL — ABNORMAL HIGH (ref 0–99)
Triglycerides: 40 mg/dL (ref 0–149)
VLDL Cholesterol Cal: 8 mg/dL (ref 5–40)

## 2023-07-08 LAB — CYTOLOGY - PAP
Adequacy: ABSENT
Chlamydia: NEGATIVE
Comment: NEGATIVE
Comment: NEGATIVE
Comment: NEGATIVE
Comment: NEGATIVE
Comment: NEGATIVE
Comment: NORMAL
Diagnosis: NEGATIVE
HPV 16: NEGATIVE
HPV 18 / 45: NEGATIVE
High risk HPV: POSITIVE — AB
Neisseria Gonorrhea: NEGATIVE
Trichomonas: NEGATIVE

## 2023-07-08 LAB — HEMOGLOBIN A1C
Est. average glucose Bld gHb Est-mCnc: 120 mg/dL
Hgb A1c MFr Bld: 5.8 % — ABNORMAL HIGH (ref 4.8–5.6)

## 2023-07-08 LAB — RPR+HBSAG+HCVAB+...
HIV Screen 4th Generation wRfx: NONREACTIVE
Hep C Virus Ab: NONREACTIVE
Hepatitis B Surface Ag: NEGATIVE
RPR Ser Ql: NONREACTIVE

## 2023-07-08 LAB — TSH RFX ON ABNORMAL TO FREE T4: TSH: 2.82 u[IU]/mL (ref 0.450–4.500)

## 2023-07-09 ENCOUNTER — Encounter: Payer: Self-pay | Admitting: Obstetrics & Gynecology

## 2023-07-09 DIAGNOSIS — R87810 Cervical high risk human papillomavirus (HPV) DNA test positive: Secondary | ICD-10-CM | POA: Insufficient documentation

## 2023-09-16 ENCOUNTER — Other Ambulatory Visit: Payer: Self-pay | Admitting: *Deleted

## 2023-09-16 MED ORDER — METRONIDAZOLE 500 MG PO TABS: 500.0000 mg | ORAL_TABLET | Freq: Two times a day (BID) | ORAL | 0 refills | Status: AC

## 2023-10-06 ENCOUNTER — Other Ambulatory Visit: Payer: Self-pay | Admitting: *Deleted

## 2023-10-06 MED ORDER — METRONIDAZOLE 0.75 % VA GEL
1.0000 | Freq: Every day | VAGINAL | 1 refills | Status: AC
Start: 2023-10-06 — End: ?

## 2023-10-14 ENCOUNTER — Other Ambulatory Visit: Payer: Self-pay | Admitting: Obstetrics & Gynecology

## 2023-10-23 ENCOUNTER — Ambulatory Visit: Admitting: Family Medicine

## 2023-11-17 ENCOUNTER — Other Ambulatory Visit: Payer: Self-pay | Admitting: Obstetrics & Gynecology

## 2023-11-17 DIAGNOSIS — N760 Acute vaginitis: Secondary | ICD-10-CM

## 2023-11-21 ENCOUNTER — Ambulatory Visit: Admitting: Obstetrics & Gynecology

## 2023-11-21 ENCOUNTER — Encounter: Payer: Self-pay | Admitting: Obstetrics & Gynecology

## 2023-11-21 VITALS — BP 117/70 | HR 84 | Wt 169.0 lb

## 2023-11-21 DIAGNOSIS — B009 Herpesviral infection, unspecified: Secondary | ICD-10-CM | POA: Diagnosis not present

## 2023-11-21 DIAGNOSIS — Z1231 Encounter for screening mammogram for malignant neoplasm of breast: Secondary | ICD-10-CM

## 2023-11-21 DIAGNOSIS — N939 Abnormal uterine and vaginal bleeding, unspecified: Secondary | ICD-10-CM

## 2023-11-21 MED ORDER — VALACYCLOVIR HCL 1 G PO TABS: 1000.0000 mg | ORAL_TABLET | Freq: Every day | ORAL | 3 refills | Status: AC

## 2023-11-21 NOTE — Progress Notes (Signed)
 GYNECOLOGY OFFICE VISIT NOTE  History:  [name] is a 58 y.o. G1P1001 here today for evaluation of vaginal spotting that occurred in the setting of application of vaginal metronidazole  gel a couple of weeks ago.  Did not occur any other time.  She denies any abnormal vaginal discharge, bleeding, pelvic pain or other concerns.  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 Benign pathology.        Postmenopausal bleeding 05/14/2018    Past Surgical History:  Procedure Laterality Date   KATRINA OSTEOTOMY Bilateral 11/04/2019   Procedure: PHALANX OSTEOTOMY-AKIN BILATERAL;  Surgeon: Lilli Cough, DPM;  Location: [facility];  Service: Podiatry;  Laterality: Bilateral;  [facility] LMA LOCAL   HYSTEROSCOPY W/ ENDOMETRIAL ABLATION  07/09/2011   HTA   Right Wrist Surgery     WISDOM TOOTH EXTRACTION      The following portions of the patient's history were reviewed and updated as appropriate: allergies, current medications, past family history, past medical history, past social history, past surgical history and problem list.   Health Maintenance:  Normal pap and positive HRHPV with negative 16, 18/45 on 07/03/2023.  Normal mammogram on 01/02/2023.   Review of Systems:  Pertinent items noted in HPI and remainder of comprehensive ROS otherwise negative.  Physical Exam:  BP 117/70   Pulse 84   Wt 169 lb (76.7 kg)   LMP  (LMP Unknown)   BMI 26.47 kg/m  CONSTITUTIONAL: Well-developed, well-nourished female in no acute distress.  HEENT:  Normocephalic, atraumatic. External right and left ear normal. No scleral icterus.  NECK: Normal range of motion, supple, no masses noted on observation SKIN: No rash noted. Not diaphoretic. No erythema. No  pallor. MUSCULOSKELETAL: Normal range of motion. No edema noted. NEUROLOGIC: Alert and oriented to person, place, and time. Normal muscle tone coordination. No cranial nerve deficit noted on observation. PSYCHIATRIC: Normal mood and affect. Normal behavior. Normal judgment and thought content. CARDIOVASCULAR: Normal heart rate noted RESPIRATORY: Effort and breath sounds normal, no problems with respiration noted ABDOMEN: No masses or other overt distention noted on observation. No tenderness.   PELVIC: Normal appearing external genitalia with mild atrophy; normal urethral meatus; vaginal mucosa and cervix with moderate atrophy.  Older irritated/excoriated area with superficial bleeding noted on upper vagina/cervix ; this corresponded to past trauma caused by applicator in the past.  No abnormal discharge noted.  Performed in the presence of a chaperone   Assessment and Plan:     1. Breast cancer screening by mammogram Next mammogram to be scheduled - MM 3D SCREENING MAMMOGRAM BILATERAL BREAST; Future  2. HSV-2 infection Refilled Valtrex  prescription as desired - valACYclovir  (VALTREX ) 1000 MG tablet; Take 1 tablet (1,000 mg total) by mouth daily. Take once daily for suppression.  Take twice a day for five days for any episode.  Dispense: 90 tablet; Refill: 3  3. Vaginal spotting (Primary) Reassured about benign etiology (trauma) for her spotting.  Precautions given.  If she has more bleeding, further evaluation will be needed. She already has vaginal estrogen cream prescribed, she uses this as needed.  Routine preventative health maintenance measures emphasized. Please refer to After Visit Summary for other counseling recommendations.   Return for any gynecologic concerns, annual/pap due March 2026.  I spent 25 minutes dedicated to the care of this patient including pre-visit review of records, face to face time with the patient discussing her conditions and treatments, post visit  ordering of medications and appropriate tests or procedures, coordinating care and documenting this visit encounter.    GLORIS HUGGER, MD, FACOG Obstetrician & Gynecologist, [facility] for [employer], [facility]

## 2024-01-05 ENCOUNTER — Ambulatory Visit

## 2024-01-06 ENCOUNTER — Ambulatory Visit
Admission: RE | Admit: 2024-01-06 | Discharge: 2024-01-06 | Disposition: A | Discharge status: Home / Self Care | Source: Ambulatory Visit | Attending: Obstetrics & Gynecology | Admitting: Obstetrics & Gynecology

## 2024-01-06 DIAGNOSIS — Z1231 Encounter for screening mammogram for malignant neoplasm of breast: Secondary | ICD-10-CM

## 2024-01-08 ENCOUNTER — Ambulatory Visit: Payer: Self-pay | Admitting: Obstetrics & Gynecology

## 2024-07-02 ENCOUNTER — Ambulatory Visit: Admitting: Obstetrics & Gynecology
